# Patient Record
Sex: Female | Born: 1950 | Race: Black or African American | Hispanic: No | Marital: Single | Smoking: Current every day smoker
Health system: Southern US, Community
[De-identification: ages and names within clinical notes are randomized; demographics above are authoritative.]

## PROBLEM LIST (undated history)

## (undated) DIAGNOSIS — M8580 Other specified disorders of bone density and structure, unspecified site: Secondary | ICD-10-CM

## (undated) DIAGNOSIS — I10 Essential (primary) hypertension: Secondary | ICD-10-CM

## (undated) DIAGNOSIS — E785 Hyperlipidemia, unspecified: Secondary | ICD-10-CM

## (undated) HISTORY — DX: Other specified disorders of bone density and structure, unspecified site: M85.80

## (undated) HISTORY — DX: Essential (primary) hypertension: I10

## (undated) HISTORY — DX: Hyperlipidemia, unspecified: E78.5

---

## 2006-08-14 ENCOUNTER — Ambulatory Visit: Payer: Self-pay | Admitting: Nurse Practitioner

## 2006-08-25 ENCOUNTER — Ambulatory Visit (HOSPITAL_COMMUNITY): Admission: RE | Admit: 2006-08-25 | Discharge: 2006-08-25 | Payer: Self-pay | Admitting: Family Medicine

## 2006-08-25 ENCOUNTER — Ambulatory Visit (HOSPITAL_COMMUNITY): Admission: RE | Admit: 2006-08-25 | Discharge: 2006-08-25 | Payer: Self-pay | Admitting: Nurse Practitioner

## 2006-08-26 ENCOUNTER — Ambulatory Visit: Payer: Self-pay | Admitting: Nurse Practitioner

## 2006-08-26 ENCOUNTER — Encounter (INDEPENDENT_AMBULATORY_CARE_PROVIDER_SITE_OTHER): Payer: Self-pay | Admitting: Nurse Practitioner

## 2006-08-27 ENCOUNTER — Ambulatory Visit: Payer: Self-pay | Admitting: *Deleted

## 2006-10-19 ENCOUNTER — Ambulatory Visit: Payer: Self-pay | Admitting: Nurse Practitioner

## 2007-01-21 ENCOUNTER — Ambulatory Visit: Payer: Self-pay | Admitting: Internal Medicine

## 2007-03-24 ENCOUNTER — Encounter (INDEPENDENT_AMBULATORY_CARE_PROVIDER_SITE_OTHER): Payer: Self-pay | Admitting: *Deleted

## 2007-05-05 ENCOUNTER — Encounter (INDEPENDENT_AMBULATORY_CARE_PROVIDER_SITE_OTHER): Payer: Self-pay | Admitting: Nurse Practitioner

## 2007-05-05 ENCOUNTER — Ambulatory Visit: Payer: Self-pay | Admitting: Family Medicine

## 2007-05-05 LAB — CONVERTED CEMR LAB
ALT: 20 units/L (ref 0–35)
Albumin: 4.2 g/dL (ref 3.5–5.2)
Alkaline Phosphatase: 73 units/L (ref 39–117)
Indirect Bilirubin: 0.2 mg/dL (ref 0.0–0.9)
Total Bilirubin: 0.3 mg/dL (ref 0.3–1.2)
Total CHOL/HDL Ratio: 5.4
Total Protein: 7.1 g/dL (ref 6.0–8.3)

## 2007-06-09 ENCOUNTER — Encounter (INDEPENDENT_AMBULATORY_CARE_PROVIDER_SITE_OTHER): Payer: Self-pay | Admitting: Nurse Practitioner

## 2007-06-09 ENCOUNTER — Ambulatory Visit: Payer: Self-pay | Admitting: Internal Medicine

## 2007-06-09 LAB — CONVERTED CEMR LAB
Cholesterol: 183 mg/dL (ref 0–200)
Total CHOL/HDL Ratio: 4.2
VLDL: 50 mg/dL — ABNORMAL HIGH (ref 0–40)

## 2007-07-14 ENCOUNTER — Ambulatory Visit: Payer: Self-pay | Admitting: Family Medicine

## 2007-07-28 ENCOUNTER — Ambulatory Visit: Payer: Self-pay | Admitting: Internal Medicine

## 2007-09-09 ENCOUNTER — Ambulatory Visit: Payer: Self-pay | Admitting: Family Medicine

## 2007-09-22 ENCOUNTER — Ambulatory Visit: Payer: Self-pay | Admitting: Internal Medicine

## 2007-09-22 ENCOUNTER — Encounter (INDEPENDENT_AMBULATORY_CARE_PROVIDER_SITE_OTHER): Payer: Self-pay | Admitting: Nurse Practitioner

## 2007-09-22 LAB — CONVERTED CEMR LAB
Alkaline Phosphatase: 96 units/L (ref 39–117)
HDL: 34 mg/dL — ABNORMAL LOW (ref >39)
Indirect Bilirubin: 0.2 mg/dL (ref 0.0–0.9)
Total CHOL/HDL Ratio: 4.8
Total Protein: 7.8 g/dL (ref 6.0–8.3)
Triglycerides: 260 mg/dL — ABNORMAL HIGH (ref <150)

## 2007-12-23 ENCOUNTER — Ambulatory Visit: Payer: Self-pay | Admitting: Nurse Practitioner

## 2008-03-24 ENCOUNTER — Ambulatory Visit: Payer: Self-pay | Admitting: Internal Medicine

## 2008-06-13 ENCOUNTER — Ambulatory Visit: Payer: Self-pay | Admitting: Family Medicine

## 2008-06-13 ENCOUNTER — Encounter (INDEPENDENT_AMBULATORY_CARE_PROVIDER_SITE_OTHER): Payer: Self-pay | Admitting: Internal Medicine

## 2008-06-13 LAB — CONVERTED CEMR LAB: HDL: 39 mg/dL — ABNORMAL LOW (ref >39)

## 2008-09-11 ENCOUNTER — Ambulatory Visit: Payer: Self-pay | Admitting: Family Medicine

## 2008-09-12 ENCOUNTER — Ambulatory Visit (HOSPITAL_COMMUNITY): Admission: RE | Admit: 2008-09-12 | Discharge: 2008-09-12 | Payer: Self-pay | Admitting: Internal Medicine

## 2008-09-19 ENCOUNTER — Ambulatory Visit (HOSPITAL_COMMUNITY): Admission: RE | Admit: 2008-09-19 | Discharge: 2008-09-19 | Payer: Self-pay | Admitting: Internal Medicine

## 2008-09-28 ENCOUNTER — Encounter (INDEPENDENT_AMBULATORY_CARE_PROVIDER_SITE_OTHER): Payer: Self-pay | Admitting: *Deleted

## 2008-10-09 ENCOUNTER — Encounter (INDEPENDENT_AMBULATORY_CARE_PROVIDER_SITE_OTHER): Payer: Self-pay | Admitting: Internal Medicine

## 2008-10-09 ENCOUNTER — Ambulatory Visit: Payer: Self-pay | Admitting: Internal Medicine

## 2008-10-09 LAB — CONVERTED CEMR LAB
ALT: 17 units/L (ref 0–35)
AST: 19 units/L (ref 0–37)
Albumin: 4.8 g/dL (ref 3.5–5.2)
Alkaline Phosphatase: 105 units/L (ref 39–117)
Chloride: 105 meq/L (ref 96–112)
Cholesterol: 120 mg/dL (ref 0–200)
Creatinine, Ser: 1.24 mg/dL — ABNORMAL HIGH (ref 0.40–1.20)
Glucose, Bld: 90 mg/dL (ref 70–99)
LDL Cholesterol: 49 mg/dL (ref 0–99)
Potassium: 3.7 meq/L (ref 3.5–5.3)
Sodium: 142 meq/L (ref 135–145)
Total CHOL/HDL Ratio: 3.4

## 2008-12-27 ENCOUNTER — Ambulatory Visit: Payer: Self-pay | Admitting: Internal Medicine

## 2009-01-19 ENCOUNTER — Ambulatory Visit: Payer: Self-pay | Admitting: Internal Medicine

## 2009-01-25 ENCOUNTER — Ambulatory Visit: Payer: Self-pay | Admitting: Internal Medicine

## 2009-11-19 ENCOUNTER — Ambulatory Visit: Payer: Self-pay | Admitting: Internal Medicine

## 2009-11-19 LAB — CONVERTED CEMR LAB
ALT: 16 units/L (ref 0–35)
AST: 18 units/L (ref 0–37)
Alkaline Phosphatase: 100 units/L (ref 39–117)
Glucose, Bld: 100 mg/dL — ABNORMAL HIGH (ref 70–99)
Potassium: 4.4 meq/L (ref 3.5–5.3)
Sodium: 142 meq/L (ref 135–145)

## 2010-07-18 ENCOUNTER — Encounter (INDEPENDENT_AMBULATORY_CARE_PROVIDER_SITE_OTHER): Payer: Self-pay | Admitting: *Deleted

## 2010-07-18 LAB — CONVERTED CEMR LAB
ALT: 18 units/L (ref 0–35)
Calcium: 9.6 mg/dL (ref 8.4–10.5)
Chloride: 103 meq/L (ref 96–112)
Cholesterol: 258 mg/dL — ABNORMAL HIGH (ref 0–200)
Potassium: 4.4 meq/L (ref 3.5–5.3)
Sodium: 137 meq/L (ref 135–145)
Total Bilirubin: 0.3 mg/dL (ref 0.3–1.2)

## 2010-09-24 ENCOUNTER — Encounter (INDEPENDENT_AMBULATORY_CARE_PROVIDER_SITE_OTHER): Payer: Self-pay | Admitting: *Deleted

## 2010-09-24 LAB — CONVERTED CEMR LAB
Cholesterol: 150 mg/dL (ref 0–200)
LDL Cholesterol: 68 mg/dL (ref 0–99)
Total CHOL/HDL Ratio: 3.7
Triglycerides: 203 mg/dL — ABNORMAL HIGH (ref <150)

## 2013-03-02 ENCOUNTER — Encounter: Payer: Self-pay | Admitting: Family Medicine

## 2013-05-27 ENCOUNTER — Encounter: Payer: Self-pay | Admitting: Family Medicine

## 2013-05-27 ENCOUNTER — Ambulatory Visit (INDEPENDENT_AMBULATORY_CARE_PROVIDER_SITE_OTHER): Payer: BC Managed Care – PPO | Admitting: Family Medicine

## 2013-05-27 VITALS — BP 128/68 | HR 73 | Temp 98.4°F | Resp 16 | Ht 60.0 in | Wt 131.8 lb

## 2013-05-27 DIAGNOSIS — Z23 Encounter for immunization: Secondary | ICD-10-CM

## 2013-05-27 DIAGNOSIS — E785 Hyperlipidemia, unspecified: Secondary | ICD-10-CM

## 2013-05-27 DIAGNOSIS — Z79899 Other long term (current) drug therapy: Secondary | ICD-10-CM

## 2013-05-27 DIAGNOSIS — Z0000 Encounter for general adult medical examination without abnormal findings: Secondary | ICD-10-CM

## 2013-05-27 DIAGNOSIS — Z1211 Encounter for screening for malignant neoplasm of colon: Secondary | ICD-10-CM

## 2013-05-27 DIAGNOSIS — F17200 Nicotine dependence, unspecified, uncomplicated: Secondary | ICD-10-CM

## 2013-05-27 LAB — CBC WITH DIFFERENTIAL/PLATELET
Basophils Absolute: 0 10*3/uL (ref 0.0–0.1)
Hemoglobin: 11.5 g/dL — ABNORMAL LOW (ref 12.0–15.0)
Lymphocytes Relative: 49 % — ABNORMAL HIGH (ref 12–46)
Lymphs Abs: 4.9 10*3/uL — ABNORMAL HIGH (ref 0.7–4.0)
MCHC: 34.3 g/dL (ref 30.0–36.0)
MCV: 82.9 fL (ref 78.0–100.0)
Monocytes Relative: 7 % (ref 3–12)
Platelets: 294 10*3/uL (ref 150–400)
RDW: 15.4 % (ref 11.5–15.5)
WBC: 10 10*3/uL (ref 4.0–10.5)

## 2013-05-27 LAB — POCT URINALYSIS DIPSTICK
Bilirubin, UA: NEGATIVE
Blood, UA: NEGATIVE
Leukocytes, UA: NEGATIVE
Nitrite, UA: NEGATIVE

## 2013-05-27 LAB — COMPREHENSIVE METABOLIC PANEL
AST: 17 U/L (ref 0–37)
Albumin: 4.7 g/dL (ref 3.5–5.2)
Alkaline Phosphatase: 68 U/L (ref 39–117)
BUN: 12 mg/dL (ref 6–23)
Glucose, Bld: 114 mg/dL — ABNORMAL HIGH (ref 70–99)
Sodium: 137 mEq/L (ref 135–145)
Total Bilirubin: 0.3 mg/dL (ref 0.3–1.2)

## 2013-05-27 LAB — LDL CHOLESTEROL, DIRECT: Direct LDL: 213 mg/dL — ABNORMAL HIGH

## 2013-05-27 NOTE — Patient Instructions (Addendum)
Come back and see me in 2 months for your pap smear and tetanus shot.  Make sure that is a morning time appointment and do not have anything to eat that day other than black coffee (ok to do a little sugar substitute or a little powdered creamer.)  In ALL combined sources of ibuprofen (a.m. And p.m.) do not take more than 10 tabs a day and do not do this regularly - only when the pain is bad. Do NOT take ibuprofen every day - try not to take more than 3-4 days out of the week.  If you need something for sleep - do not take ibuprofen p.m. - take just plan diphenhydramine (generic benadryl which is the ingredient in ibuprofen p.m. That makes you sleepy.)   Also, if you need more pain control try substituting generic tylenol (acetaminophen) which also takes in a p.m. Formulations. Do NOT combine ibuprofen with any other over the counter medication other than tyelnol or acetaminophen - so no aleve, motrin, advil, goody's powders, bc powders, etc as these are all the same type of medication so you will accidentally overdose yourself which will harm your kidneys.  Keeping You Healthy  Get These Tests  Blood Pressure- Have your blood pressure checked by your healthcare provider at least once a year.  Normal blood pressure is 120/80.  Weight- Have your body mass index (BMI) calculated to screen for obesity.  BMI is a measure of body fat based on height and weight.  You can calculate your own BMI at https://www.west-esparza.com/  Cholesterol- Have your cholesterol checked every year.  Diabetes- Have your blood sugar checked every year if you have high blood pressure, high cholesterol, a family history of diabetes or if you are overweight.  Pap Smear- Have a pap smear every 1 to 3 years if you have been sexually active.  If you are older than 65 and recent pap smears have been normal you may not need additional pap smears.  In addition, if you have had a hysterectomy  For benign disease additional pap smears are  not necessary.  Mammogram-Yearly mammograms are essential for early detection of breast cancer  Screening for Colon Cancer- Colonoscopy starting at age 67. Screening may begin sooner depending on your family history and other health conditions.  Follow up colonoscopy as directed by your Gastroenterologist.  Screening for Osteoporosis- Screening begins at age 24 with bone density scanning, sooner if you are at higher risk for developing Osteoporosis.  Get these medicines  Calcium with Vitamin D- Your body requires 1200-1500 mg of Calcium a day and [phone] IU of Vitamin D a day.  You can only absorb 500 mg of Calcium at a time therefore Calcium must be taken in 2 or 3 separate doses throughout the day.  Hormones- Hormone therapy has been associated with increased risk for certain cancers and heart disease.  Talk to your healthcare provider about if you need relief from menopausal symptoms.  Aspirin- Ask your healthcare provider about taking Aspirin to prevent Heart Disease and Stroke.  Get these Immuniztions  Flu shot- Every fall  Pneumonia shot- Once after the age of 21; if you are younger ask your healthcare provider if you need a pneumonia shot.  Tetanus- Every ten years.  Zostavax- Once after the age of 51 to prevent shingles.  Take these steps  Don't smoke- Your healthcare provider can help you quit. For tips on how to quit, ask your healthcare provider or go to www.smokefree.gov or call  1-800 QUIT-NOW.  Be physically active- Exercise 5 days a week for a minimum of 30 minutes.  If you are not already physically active, start slow and gradually work up to 30 minutes of moderate physical activity.  Try walking, dancing, bike riding, swimming, etc.  Eat a healthy diet- Eat a variety of healthy foods such as fruits, vegetables, whole grains, low fat milk, low fat cheeses, yogurt, lean meats, chicken, fish, eggs, dried beans, tofu, etc.  For more information go to  www.thenutritionsource.org  Dental visit- Brush and floss teeth twice daily; visit your dentist twice a year.  Eye exam- Visit your Optometrist or Ophthalmologist yearly.  Drink alcohol in moderation- Limit alcohol intake to one drink or less a day.  Never drink and drive.  Depression- Your emotional health is as important as your physical health.  If you're feeling down or losing interest in things you normally enjoy, please talk to your healthcare provider.  Seat Belts- can save your life; always wear one  Smoke/Carbon Monoxide detectors- These detectors need to be installed on the appropriate level of your home.  Replace batteries at least once a year.  Violence- If anyone is threatening or hurting you, please tell your healthcare provider.  Living Will/ Health care power of attorney- Discuss with your healthcare provider and family. Smoking Hazards Smoking cigarettes is extremely bad for your health. Tobacco smoke has over 200 known poisons in it. There are over 60 chemicals in tobacco smoke that cause cancer. Some of the chemicals found in cigarette smoke include:   Cyanide.  Benzene.  Formaldehyde.  Methanol (wood alcohol).  Acetylene (fuel used in welding torches).  Ammonia. Cigarette smoke also contains the poisonous gases nitrogen oxide and carbon monoxide.  Cigarette smokers have an increased risk of many serious medical problems, including:  Lung cancer.  Lung disease (such as pneumonia, bronchitis, and emphysema).  Heart attack and chest pain due to the heart not getting enough oxygen (angina).  Heart disease and peripheral blood vessel disease.  Hypertension.  Stroke.  Oral cancer (cancer of the lip, mouth, or voice box).  Bladder cancer.  Pancreatic cancer.  Cervical cancer.  Pregnancy complications, including premature birth.  Low birthweight babies.  Early menopause.  Lower estrogen level for women.  Infertility.  Facial  wrinkles.  Blindness.  Increased risk of broken bones (fractures).  Senile dementia.  Stillbirths and smaller newborn babies, birth defects, and genetic damage to sperm.  Stomach ulcers and internal bleeding. Children of smokers have an increased risk of the following, because of secondhand smoke exposure:   Sudden infant death syndrome (SIDS).  Respiratory infections.  Lung cancer.  Heart disease.  Ear infections. Smoking causes approximately:  90% of all lung cancer deaths in men.  80% of all lung cancer deaths in women.  90% of deaths from chronic obstructive lung disease. Compared with nonsmokers, smoking increases the risk of:  Coronary heart disease by 2 to 4 times.  Stroke by 2 to 4 times.  Men developing lung cancer by 23 times.  Women developing lung cancer by 13 times.  Dying from chronic obstructive lung diseases by 12 times. Someone who smokes 2 packs a day loses about 8 years of his or her life. Even smoking lightly shortens your life expectancy by several years. You can greatly reduce the risk of medical problems for you and your family by stopping now. Smoking is the most preventable cause of death and disease in our society. Within days of quitting smoking,  your circulation returns to normal, you decrease the risk of having a heart attack, and your lung capacity improves. There may be some increased phlegm in the first few days after quitting, and it may take months for your lungs to clear up completely. Quitting for 10 years cuts your lung cancer risk to almost that of a nonsmoker. WHY IS SMOKING ADDICTIVE?  Nicotine is the chemical agent in tobacco that is capable of causing addiction or dependence.  When you smoke and inhale, nicotine is absorbed rapidly into the bloodstream through your lungs. Nicotine absorbed through the lungs is capable of creating a powerful addiction. Both inhaled and non-inhaled nicotine may be addictive.  Addiction studies of  cigarettes and spit tobacco show that addiction to nicotine occurs mainly during the teen years, when young people begin using tobacco products. WHAT ARE THE BENEFITS OF QUITTING?  There are many health benefits to quitting smoking.   Likelihood of developing cancer and heart disease decreases. Health improvements are seen almost immediately.  Blood pressure, pulse rate, and breathing patterns start returning to normal soon after quitting.  People who quit may see an improvement in their overall quality of life. Some people choose to quit all at once. Other options include nicotine replacement products, such as patches, gum, and nasal sprays. Do not use these products without first checking with your caregiver. QUITTING SMOKING It is not easy to quit smoking. Nicotine is addicting, and longtime habits are hard to change. To start, you can write down all your reasons for quitting, tell your family and friends you want to quit, and ask for their help. Throw your cigarettes away, chew gum or cinnamon sticks, keep your hands busy, and drink extra water or juice. Go for walks and practice deep breathing to relax. Think of all the money you are saving: around $1,000 a year, for the average pack-a-day smoker. Nicotine patches and gum have been shown to improve success at efforts to stop smoking. Zyban (bupropion) is an anti-depressant drug that can be prescribed to reduce nicotine withdrawal symptoms and to suppress the urge to smoke. Smoking is an addiction with both physical and psychological effects. Joining a stop-smoking support group can help you cope with the emotional issues. For more information and advice on programs to stop smoking, call your doctor, your local hospital, or these organizations:  American Lung Association - 1-800-LUNGUSA  American Cancer Society - 1-800-ACS-2345 Document Released: 07/31/2004 Document Revised: 09/15/2011 Document Reviewed: 12/13/2012 [facility] Patient Information  2014 [place], [place]. Smoking Cessation, Tips for Success YOU CAN QUIT SMOKING If you are ready to quit smoking, congratulations! You have chosen to help yourself be healthier. Cigarettes bring nicotine, tar, carbon monoxide, and other irritants into your body. Your lungs, heart, and blood vessels will be able to work better without these poisons. There are many different ways to quit smoking. Nicotine gum, nicotine patches, a nicotine inhaler, or nicotine nasal spray can help with physical craving. Hypnosis, support groups, and medicines help break the habit of smoking. Here are some tips to help you quit for good.  Throw away all cigarettes.  Clean and remove all ashtrays from your home, work, and car.  On a card, write down your reasons for quitting. Carry the card with you and read it when you get the urge to smoke.  Cleanse your body of nicotine. Drink enough water and fluids to keep your urine clear or pale yellow. Do this after quitting to flush the nicotine from your body.  Learn to predict your moods. Do not let a bad situation be your excuse to have a cigarette. Some situations in your life might tempt you into wanting a cigarette.  Never have "just one" cigarette. It leads to wanting another and another. Remind yourself of your decision to quit.  Change habits associated with smoking. If you smoked while driving or when feeling stressed, try other activities to replace smoking. Stand up when drinking your coffee. Brush your teeth after eating. Sit in a different chair when you read the paper. Avoid alcohol while trying to quit, and try to drink fewer caffeinated beverages. Alcohol and caffeine may urge you to smoke.  Avoid foods and drinks that can trigger a desire to smoke, such as sugary or spicy foods and alcohol.  Ask people who smoke not to smoke around you.  Have something planned to do right after eating or having a cup of coffee. Take a walk or exercise to perk you up. This  will help to keep you from overeating.  Try a relaxation exercise to calm you down and decrease your stress. Remember, you may be tense and nervous for the first 2 weeks after you quit, but this will pass.  Find new activities to keep your hands busy. Play with a pen, coin, or rubber band. Doodle or draw things on paper.  Brush your teeth right after eating. This will help cut down on the craving for the taste of tobacco after meals. You can try mouthwash, too.  Use oral substitutes, such as lemon drops, carrots, a cinnamon stick, or chewing gum, in place of cigarettes. Keep them handy so they are available when you have the urge to smoke.  When you have the urge to smoke, try deep breathing.  Designate your home as a nonsmoking area.  If you are a heavy smoker, ask your caregiver about a prescription for nicotine chewing gum. It can ease your withdrawal from nicotine.  Reward yourself. Set aside the cigarette money you save and buy yourself something nice.  Look for support from others. Join a support group or smoking cessation program. Ask someone at home or at work to help you with your plan to quit smoking.  Always ask yourself, "Do I need this cigarette or is this just a reflex?" Tell yourself, "Today, I choose not to smoke," or "I do not want to smoke." You are reminding yourself of your decision to quit, even if you do smoke a cigarette. HOW WILL I FEEL WHEN I QUIT SMOKING?  The benefits of not smoking start within days of quitting.  You may have symptoms of withdrawal because your body is used to nicotine (the addictive substance in cigarettes). You may crave cigarettes, be irritable, feel very hungry, cough often, get headaches, or have difficulty concentrating.  The withdrawal symptoms are only temporary. They are strongest when you first quit but will go away within 10 to 14 days.  When withdrawal symptoms occur, stay in control. Think about your reasons for quitting. Remind  yourself that these are signs that your body is healing and getting used to being without cigarettes.  Remember that withdrawal symptoms are easier to treat than the major diseases that smoking can cause.  Even after the withdrawal is over, expect periodic urges to smoke. However, these cravings are generally short-lived and will go away whether you smoke or not. Do not smoke!  If you relapse and smoke again, do not lose hope. Most smokers quit 3 times before  they are successful.  If you relapse, do not give up! Plan ahead and think about what you will do the next time you get the urge to smoke. LIFE AS A NONSMOKER: MAKE IT FOR A MONTH, MAKE IT FOR LIFE Day 1: Hang this page where you will see it every day. Day 2: Get rid of all ashtrays, matches, and lighters. Day 3: Drink water. Breathe deeply between sips. Day 4: Avoid places with smoke-filled air, such as bars, clubs, or the smoking section of restaurants. Day 5: Keep track of how much money you save by not smoking. Day 6: Avoid boredom. Keep a good book with you or go to the movies. Day 7: Reward yourself! One week without smoking! Day 8: Make a dental appointment to get your teeth cleaned. Day 9: Decide how you will turn down a cigarette before it is offered to you. Day 10: Review your reasons for quitting. Day 11: Distract yourself. Stay active to keep your mind off smoking and to relieve tension. Take a walk, exercise, read a book, do a crossword puzzle, or try a new hobby. Day 12: Exercise. Get off the bus before your stop or use stairs instead of escalators. Day 13: Call on friends for support and encouragement. Day 14: Reward yourself! Two weeks without smoking! Day 15: Practice deep breathing exercises. Day 16: Bet a friend that you can stay a nonsmoker. Day 17: Ask to sit in nonsmoking sections of restaurants. Day 18: Hang up "No Smoking" signs. Day 19: Think of yourself as a nonsmoker. Day 20: Each morning, tell yourself  you will not smoke. Day 21: Reward yourself! Three weeks without smoking! Day 22: Think of smoking in negative ways. Remember how it stains your teeth, gives you bad breath, and leaves you short of breath. Day 23: Eat a nutritious breakfast. Day 24:Do not relive your days as a smoker. Day 25: Hold a pencil in your hand when talking on the telephone. Day 26: Tell all your friends you do not smoke. Day 27: Think about how much better food tastes. Day 28: Remember, one cigarette is one too many. Day 29: Take up a hobby that will keep your hands busy. Day 30: Congratulations! One month without smoking! Give yourself a big reward. Your caregiver can direct you to community resources or hospitals for support, which may include:  Group support.  Education.  Hypnosis.  Subliminal therapy. Document Released: 03/21/2004 Document Revised: 09/15/2011 Document Reviewed: 12/09/2012 [facility] Patient Information 2014 [place], [place].

## 2013-05-27 NOTE — Progress Notes (Addendum)
Subjective:    Patient ID: Sharon Peterson, female    DOB: 11-Nov-1950, 63 y.o.   MRN: [id number] Chief Complaint  Patient presents with  . Annual Exam    no pap  . Medication Refill   HPI  Is not fasting today - ate about 3 hrs ago. Has not had any care since [facility] 1 1/2 yrs ago.  She does have her medical records.  Only prior med prob w/ [facility] - was on crestor 20 but has been out for > 1 yr.  Last pap smear was a long time ago but probably w/in the past 5 yrs - maybe w/in the past 3 yrs.  No h/o abnml pap smear. Will schedule a pap and a tdap - maybe next month but absolutely does not want to do today. No sexual activity in a long time - def >5 yrs.  No h/o colonscopy.   Smoking since she was 62 yo. - smoking almost 1 ppd - 17 cigs/d.  40 pack yr hx. Really no interest in cutting down - feels like she has already done well  Going to dentist, needs dentures so take ibuprofen 3-4 at night and ibuprofen 2-4 tabs sev times/day. No indigeston or heartburn or upset stomach.     History reviewed. No pertinent past medical history. History reviewed. No pertinent past surgical history. No current outpatient prescriptions on file prior to visit.   No current facility-administered medications on file prior to visit.   No Known Allergies History reviewed. No pertinent family history. History   Social History  . Marital Status: Single    Spouse Name: N/A    Number of Children: N/A  . Years of Education: N/A   Social History Main Topics  . Smoking status: Current Every Day Smoker  . Smokeless tobacco: None  . Alcohol Use: No  . Drug Use: No  . Sexual Activity: None   Other Topics Concern  . None   Social History Narrative   Significant other. Education: [employer].  retired, no exercise  Review of Systems  Constitutional: Negative.   HENT: Negative.   Eyes: Negative.   Respiratory: Negative.   Cardiovascular: Negative.   Gastrointestinal: Negative.   Endocrine:  Negative.   Genitourinary: Negative.   Musculoskeletal: Negative.   Skin: Negative.   Allergic/Immunologic: Negative.   Neurological: Negative.   Hematological: Negative.   Psychiatric/Behavioral: Negative.       BP 128/68  Pulse 73  Temp(Src) 98.4 F (36.9 C) (Oral)  Resp 16  Ht 5' (1.524 m)  Wt 131 lb 12.8 oz (59.784 kg)  BMI 25.74 kg/m2  SpO2 98% Objective:   Physical Exam  Constitutional: She is oriented to person, place, and time. She appears well-developed and well-nourished. No distress.  HENT:  Head: Normocephalic and atraumatic.  Right Ear: Tympanic membrane, external ear and ear canal normal.  Left Ear: Tympanic membrane, external ear and ear canal normal.  Nose: Nose normal. No mucosal edema or rhinorrhea.  Mouth/Throat: Uvula is midline, oropharynx is clear and moist and mucous membranes are normal. No posterior oropharyngeal erythema.  Eyes: Conjunctivae and EOM are normal. Pupils are equal, round, and reactive to light. Right eye exhibits no discharge. Left eye exhibits no discharge. No scleral icterus.  Neck: Normal range of motion. Neck supple. No thyromegaly present.  Cardiovascular: Normal rate, regular rhythm, normal heart sounds and intact distal pulses.   Pulmonary/Chest: Effort normal and breath sounds normal. No respiratory distress.  Abdominal: Soft. Bowel sounds  are normal. There is no tenderness.  Genitourinary: No breast swelling, tenderness, discharge or bleeding.  Musculoskeletal: She exhibits no edema.  Lymphadenopathy:    She has no cervical adenopathy.  Neurological: She is alert and oriented to person, place, and time.  Skin: Skin is warm and dry. She is not diaphoretic. No erythema.  Psychiatric: She has a normal mood and affect. Her behavior is normal.          Results for orders placed in visit on 05/27/13  POCT URINALYSIS DIPSTICK      Result Value Range   Color, UA yellow     Clarity, UA clear     Glucose, UA neg     Bilirubin,  UA neg     Ketones, UA neg     Spec Grav, UA <=1.005     Blood, UA neg     pH, UA 6.0     Protein, UA neg     Urobilinogen, UA 0.2     Nitrite, UA neg     Leukocytes, UA Negative     Assessment & Plan:   Routine general medical examination at a health care facility - Plan: MM Digital Screening, Hepatitis C antibody, Comprehensive metabolic panel, CBC with Differential, TSH, POCT urinalysis dipstick - will RTC for pap smear at next visit - sched for 2 mos, will do TDaP then as well.  Needs pneumovax but may want to wait till after 62 yo in April 2015.  Discuss zoster vac at f/u, discuss asa 81 at f/u, discuss ca/vit D supp at f/u. Will needs dexa referral after April 2015.  Tobacco use disorder- no interest in quitting further at this point as has already cut down some.  Need for prophylactic vaccination and inoculation against influenza - Plan: Flu Vaccine QUAD 36+ mos IM  Special screening for malignant neoplasms, colon - Plan: Ambulatory referral to Gastroenterology  Encounter for long-term (current) use of other medications  Other and unspecified hyperlipidemia - Plan: LDL Cholesterol, Direct, Comprehensive metabolic panel - not fasting to day so just check LDL to see if pt needs to restart statin immed. Check full fasting lipid panel at f/u.   Addendum: direct LDL 213 so have pt start atorvastatin 40 and recheck cmp and flp at f/u OV in 2 mos.  Meds ordered this encounter  Medications  . rosuvastatin (CRESTOR) 20 MG tablet    Sig: Take 20 mg by mouth daily.  Marland Kitchen OVER THE COUNTER MEDICATION    Sig: OTC fish Oil taking    Norberto Sorenson, MD MPH

## 2013-05-28 LAB — HEPATITIS C ANTIBODY: HCV Ab: NEGATIVE

## 2013-05-31 ENCOUNTER — Other Ambulatory Visit: Payer: Self-pay | Admitting: Family Medicine

## 2013-05-31 DIAGNOSIS — Z1231 Encounter for screening mammogram for malignant neoplasm of breast: Secondary | ICD-10-CM

## 2013-06-04 ENCOUNTER — Encounter: Payer: Self-pay | Admitting: Family Medicine

## 2013-06-04 DIAGNOSIS — F17200 Nicotine dependence, unspecified, uncomplicated: Secondary | ICD-10-CM | POA: Insufficient documentation

## 2013-06-04 DIAGNOSIS — E785 Hyperlipidemia, unspecified: Secondary | ICD-10-CM | POA: Insufficient documentation

## 2013-06-04 MED ORDER — ATORVASTATIN CALCIUM 40 MG PO TABS: 40.0000 mg | ORAL_TABLET | Freq: Every day | ORAL | Status: DC

## 2013-06-04 NOTE — Addendum Note (Signed)
Addended by: Norberto Sorenson on: 06/04/2013 01:57 AM   Modules accepted: Orders, Medications

## 2013-07-29 ENCOUNTER — Ambulatory Visit (INDEPENDENT_AMBULATORY_CARE_PROVIDER_SITE_OTHER): Payer: BC Managed Care – PPO | Admitting: Family Medicine

## 2013-07-29 VITALS — BP 128/78 | HR 79 | Temp 97.5°F | Resp 16 | Ht 60.0 in | Wt 130.0 lb

## 2013-07-29 DIAGNOSIS — F17200 Nicotine dependence, unspecified, uncomplicated: Secondary | ICD-10-CM

## 2013-07-29 DIAGNOSIS — N760 Acute vaginitis: Secondary | ICD-10-CM

## 2013-07-29 DIAGNOSIS — Z01419 Encounter for gynecological examination (general) (routine) without abnormal findings: Secondary | ICD-10-CM

## 2013-07-29 DIAGNOSIS — Z23 Encounter for immunization: Secondary | ICD-10-CM

## 2013-07-29 DIAGNOSIS — E785 Hyperlipidemia, unspecified: Secondary | ICD-10-CM

## 2013-07-29 DIAGNOSIS — Z79899 Other long term (current) drug therapy: Secondary | ICD-10-CM

## 2013-07-29 DIAGNOSIS — B9689 Other specified bacterial agents as the cause of diseases classified elsewhere: Secondary | ICD-10-CM

## 2013-07-29 LAB — COMPREHENSIVE METABOLIC PANEL
ALT: 12 U/L (ref 0–35)
AST: 21 U/L (ref 0–37)
Albumin: 4.8 g/dL (ref 3.5–5.2)
Alkaline Phosphatase: 65 U/L (ref 39–117)
BUN: 10 mg/dL (ref 6–23)
CHLORIDE: 105 meq/L (ref 96–112)
CO2: 23 mEq/L (ref 19–32)
Calcium: 10.3 mg/dL (ref 8.4–10.5)
Creat: 0.93 mg/dL (ref 0.50–1.10)
Glucose, Bld: 81 mg/dL (ref 70–99)
POTASSIUM: 4.8 meq/L (ref 3.5–5.3)
SODIUM: 137 meq/L (ref 135–145)
Total Bilirubin: 0.4 mg/dL (ref 0.3–1.2)
Total Protein: 7.7 g/dL (ref 6.0–8.3)

## 2013-07-29 LAB — LIPID PANEL
CHOL/HDL RATIO: 4.6 ratio
Cholesterol: 194 mg/dL (ref 0–200)
HDL: 42 mg/dL (ref >39)
LDL Cholesterol: 107 mg/dL — ABNORMAL HIGH (ref 0–99)
Triglycerides: 223 mg/dL — ABNORMAL HIGH (ref <150)
VLDL: 45 mg/dL — ABNORMAL HIGH (ref 0–40)

## 2013-07-29 NOTE — Progress Notes (Signed)
Subjective:    Patient ID: Sharon Peterson, female    DOB: [birth year], 63 y.o.   MRN: [id number]  Chief Complaint  Patient presents with  . Gynecologic Exam  . Follow-up    cholesterol check. not fasting, has had coffee w/cream & sugar  . Immunizations    TDAP   This chart was scribed for Sherren MochaEva N Tamula Morrical, MD by Dorothey Basemania Sutton, ED Scribe.   HPI Sharon Peterson is a 63 y.o. Female with a history of hyperlipidemia who presents to [facility] for a routine follow-up.  Patient is requesting a gynecological exam with pap smear, but does not have any associated vaginal complaints.  Has been many years since her last.  Patient is also following up for a cholesterol check. Patient states that she has not fasted this morning (had coffee with cream and sugar). At her last visit 2 months ago we did a direct LDL because she did not fast before that appointment. Her direct LDL was found to be 213 at that visit and patient was started on Lipitor.   Patient is a current, every day smoker w/ no intention of quitting.   Patient is also requesting a tetanus vaccination.  No past medical history on file. Current Outpatient Prescriptions on File Prior to Visit  Medication Sig Dispense Refill  . atorvastatin (LIPITOR) 40 MG tablet Take 1 tablet (40 mg total) by mouth daily.  90 tablet  0  . OVER THE COUNTER MEDICATION OTC fish Oil taking       No current facility-administered medications on file prior to visit.   No Known Allergies  Review of Systems  Constitutional: Negative for fever, chills, appetite change and unexpected weight change.  Respiratory: Negative for cough.   Gastrointestinal: Negative for vomiting, abdominal pain, diarrhea and constipation.  Genitourinary: Negative for vaginal bleeding, vaginal discharge, genital sores, vaginal pain and pelvic pain.  Skin: Negative for rash.  Neurological: Negative for dizziness and seizures.     Vitals: BP 128/78  Pulse 79   Temp(Src) 97.5 F (36.4 C)  Resp 16  Ht 5' (1.524 m)  Wt 130 lb (58.968 kg)  BMI 25.39 kg/m2  SpO2 99% Objective:   Physical Exam  Nursing note and vitals reviewed. Constitutional: She is oriented to person, place, and time. She appears well-developed and well-nourished. No distress.  HENT:  Head: Normocephalic and atraumatic.  Eyes: Conjunctivae are normal.  Neck: Normal range of motion. Neck supple.  Cardiovascular: Normal rate, regular rhythm and normal heart sounds.   Pulmonary/Chest: Effort normal and breath sounds normal. No respiratory distress.  Abdominal: She exhibits no distension.  Genitourinary: Vagina normal. Uterus is tender. Cervix exhibits discharge. Right adnexum displays no tenderness. Left adnexum displays no tenderness.  Cervix is highly vascular with diffuse erythematous pinpoint spotting. Small amount of white, creamy discharge from the cervix. Vagina otherwise normal. Uterus is tender to palpation, but without adnexal tenderness.   Musculoskeletal: Normal range of motion.  Neurological: She is alert and oriented to person, place, and time.  Skin: Skin is warm and dry.  Psychiatric: She has a normal mood and affect. Her behavior is normal.      Assessment & Plan:  9:47 AM- Need for Tdap vaccination - Plan: Tdap vaccine greater than or equal to 7yo IM  Other and unspecified hyperlipidemia - Plan: Lipid panel - advised to switch from whole milk to skim - at least 1%.  Recheck in 4 mos when FASTING.  Encounter for  long-term (current) use of other medications - Plan: Comprehensive metabolic panel  Tobacco use disorder - encouraged cessation - pt does not want to quit - has no intention of cutting down.  Encounter for routine gynecological examination - Plan: Pap IG, CT/NG NAA, and HPV (high risk)   Discussed treatment plan with patient at bedside and patient verbalized agreement.   I personally performed the services described in this documentation, which  was scribed in my presence. The recorded information has been reviewed and considered, and addended by me as needed.  Norberto Sorenson, MD MPH

## 2013-07-31 ENCOUNTER — Encounter: Payer: Self-pay | Admitting: Family Medicine

## 2013-08-01 LAB — PAP IG, CT-NG NAA, HPV HIGH-RISK
Chlamydia Probe Amp: NEGATIVE
GC Probe Amp: NEGATIVE
HPV DNA High Risk: NOT DETECTED

## 2013-08-02 ENCOUNTER — Encounter: Payer: Self-pay | Admitting: Family Medicine

## 2013-08-02 MED ORDER — METRONIDAZOLE 500 MG PO TABS: 500.0000 mg | ORAL_TABLET | Freq: Two times a day (BID) | ORAL | Status: DC

## 2013-08-02 NOTE — Addendum Note (Signed)
Addended by: Norberto SorensonSHAW, EVA on: 08/02/2013 02:46 PM   Modules accepted: Orders

## 2013-09-05 ENCOUNTER — Other Ambulatory Visit: Payer: Self-pay | Admitting: Family Medicine

## 2013-12-02 ENCOUNTER — Other Ambulatory Visit: Payer: Self-pay

## 2013-12-02 MED ORDER — ATORVASTATIN CALCIUM 40 MG PO TABS: ORAL_TABLET | ORAL | Status: DC

## 2013-12-09 ENCOUNTER — Other Ambulatory Visit: Payer: Self-pay | Admitting: Family Medicine

## 2013-12-09 ENCOUNTER — Ambulatory Visit (INDEPENDENT_AMBULATORY_CARE_PROVIDER_SITE_OTHER): Payer: Medicare HMO | Admitting: Family Medicine

## 2013-12-09 ENCOUNTER — Encounter: Payer: Self-pay | Admitting: Family Medicine

## 2013-12-09 VITALS — BP 124/71 | HR 72 | Temp 98.0°F | Resp 18 | Ht 60.0 in | Wt 126.4 lb

## 2013-12-09 DIAGNOSIS — E785 Hyperlipidemia, unspecified: Secondary | ICD-10-CM

## 2013-12-09 DIAGNOSIS — M8580 Other specified disorders of bone density and structure, unspecified site: Secondary | ICD-10-CM

## 2013-12-09 DIAGNOSIS — Z13820 Encounter for screening for osteoporosis: Secondary | ICD-10-CM

## 2013-12-09 DIAGNOSIS — Z79899 Other long term (current) drug therapy: Secondary | ICD-10-CM

## 2013-12-09 DIAGNOSIS — Z780 Asymptomatic menopausal state: Secondary | ICD-10-CM

## 2013-12-09 DIAGNOSIS — F17200 Nicotine dependence, unspecified, uncomplicated: Secondary | ICD-10-CM

## 2013-12-09 LAB — LIPID PANEL
CHOLESTEROL: 161 mg/dL (ref 0–200)
HDL: 33 mg/dL — AB (ref >39)
LDL CALC: 92 mg/dL (ref 0–99)
TRIGLYCERIDES: 178 mg/dL — AB (ref <150)
Total CHOL/HDL Ratio: 4.9 Ratio
VLDL: 36 mg/dL (ref 0–40)

## 2013-12-09 LAB — COMPREHENSIVE METABOLIC PANEL
ALBUMIN: 4.6 g/dL (ref 3.5–5.2)
ALK PHOS: 66 U/L (ref 39–117)
ALT: 12 U/L (ref 0–35)
AST: 18 U/L (ref 0–37)
BILIRUBIN TOTAL: 0.3 mg/dL (ref 0.2–1.2)
BUN: 8 mg/dL (ref 6–23)
CALCIUM: 9.8 mg/dL (ref 8.4–10.5)
CO2: 19 meq/L (ref 19–32)
CREATININE: 1.01 mg/dL (ref 0.50–1.10)
Chloride: 106 mEq/L (ref 96–112)
GLUCOSE: 116 mg/dL — AB (ref 70–99)
POTASSIUM: 4.3 meq/L (ref 3.5–5.3)
Sodium: 136 mEq/L (ref 135–145)
TOTAL PROTEIN: 7.3 g/dL (ref 6.0–8.3)

## 2013-12-09 MED ORDER — ATORVASTATIN CALCIUM 40 MG PO TABS: ORAL_TABLET | ORAL | Status: DC

## 2013-12-09 NOTE — Patient Instructions (Addendum)
Call the breast center to schedule your mammogram and your DEXA bone scan at your convenience [address], [place], [place] [address]  [phone]  Come back in see me in late January/early February for your physical.  Fat and Cholesterol Control Diet Fat and cholesterol levels in your blood and organs are influenced by your diet. High levels of fat and cholesterol may lead to diseases of the heart, small and large blood vessels, gallbladder, liver, and pancreas. CONTROLLING FAT AND CHOLESTEROL WITH DIET Although exercise and lifestyle factors are important, your diet is key. That is because certain foods are known to raise cholesterol and others to lower it. The goal is to balance foods for their effect on cholesterol and more importantly, to replace saturated and trans fat with other types of fat, such as monounsaturated fat, polyunsaturated fat, and omega-3 fatty acids. On average, a person should consume no more than 15 to 17 g of saturated fat daily. Saturated and trans fats are considered "bad" fats, and they will raise LDL cholesterol. Saturated fats are primarily found in animal products such as meats, butter, and cream. However, that does not mean you need to give up all your favorite foods. Today, there are good tasting, low-fat, low-cholesterol substitutes for most of the things you like to eat. Choose low-fat or nonfat alternatives. Choose round or loin cuts of red meat. These types of cuts are lowest in fat and cholesterol. Chicken (without the skin), fish, veal, and ground [place] breast are great choices. Eliminate fatty meats, such as hot dogs and salami. Even shellfish have little or no saturated fat. Have a 3 oz (85 g) portion when you eat lean meat, poultry, or fish. Trans fats are also called "partially hydrogenated oils." They are oils that have been scientifically manipulated so that they are solid at room temperature resulting in a longer shelf life and improved taste  and texture of foods in which they are added. Trans fats are found in stick margarine, some tub margarines, cookies, crackers, and baked goods.  When baking and cooking, oils are a great substitute for butter. The monounsaturated oils are especially beneficial since it is believed they lower LDL and raise HDL. The oils you should avoid entirely are saturated tropical oils, such as coconut and palm.  Remember to eat a lot from food groups that are naturally free of saturated and trans fat, including fish, fruit, vegetables, beans, grains (barley, rice, couscous, bulgur wheat), and pasta (without cream sauces).  IDENTIFYING FOODS THAT LOWER FAT AND CHOLESTEROL  Soluble fiber may lower your cholesterol. This type of fiber is found in fruits such as apples, vegetables such as broccoli, potatoes, and carrots, legumes such as beans, peas, and lentils, and grains such as barley. Foods fortified with plant sterols (phytosterol) may also lower cholesterol. You should eat at least 2 g per day of these foods for a cholesterol lowering effect.  Read package labels to identify low-saturated fats, trans fat free, and low-fat foods at the supermarket. Select cheeses that have only 2 to 3 g saturated fat per ounce. Use a heart-healthy tub margarine that is free of trans fats or partially hydrogenated oil. When buying baked goods (cookies, crackers), avoid partially hydrogenated oils. Breads and muffins should be made from whole grains (whole-wheat or whole oat flour, instead of "flour" or "enriched flour"). Buy non-creamy canned soups with reduced salt and no added fats.  FOOD PREPARATION TECHNIQUES  Never deep-fry. If you must fry, either stir-fry,  which uses very little fat, or use non-stick cooking sprays. When possible, broil, bake, or roast meats, and steam vegetables. Instead of putting butter or margarine on vegetables, use lemon and herbs, applesauce, and cinnamon (for squash and sweet potatoes). Use nonfat yogurt,  salsa, and low-fat dressings for salads.  LOW-SATURATED FAT / LOW-FAT FOOD SUBSTITUTES Meats / Saturated Fat (g)  Avoid: Steak, marbled (3 oz/85 g) / 11 g  Choose: Steak, lean (3 oz/85 g) / 4 g  Avoid: Hamburger (3 oz/85 g) / 7 g  Choose: Hamburger, lean (3 oz/85 g) / 5 g  Avoid: Ham (3 oz/85 g) / 6 g  Choose: Ham, lean cut (3 oz/85 g) / 2.4 g  Avoid: Chicken, with skin, dark meat (3 oz/85 g) / 4 g  Choose: Chicken, skin removed, dark meat (3 oz/85 g) / 2 g  Avoid: Chicken, with skin, light meat (3 oz/85 g) / 2.5 g  Choose: Chicken, skin removed, light meat (3 oz/85 g) / 1 g Dairy / Saturated Fat (g)  Avoid: Whole milk (1 cup) / 5 g  Choose: Low-fat milk, 2% (1 cup) / 3 g  Choose: Low-fat milk, 1% (1 cup) / 1.5 g  Choose: Skim milk (1 cup) / 0.3 g  Avoid: Hard cheese (1 oz/28 g) / 6 g  Choose: Skim milk cheese (1 oz/28 g) / 2 to 3 g  Avoid: Cottage cheese, 4% fat (1 cup) / 6.5 g  Choose: Low-fat cottage cheese, 1% fat (1 cup) / 1.5 g  Avoid: Ice cream (1 cup) / 9 g  Choose: Sherbet (1 cup) / 2.5 g  Choose: Nonfat frozen yogurt (1 cup) / 0.3 g  Choose: Frozen fruit bar / trace  Avoid: Whipped cream (1 tbs) / 3.5 g  Choose: Nondairy whipped topping (1 tbs) / 1 g Condiments / Saturated Fat (g)  Avoid: Mayonnaise (1 tbs) / 2 g  Choose: Low-fat mayonnaise (1 tbs) / 1 g  Avoid: Butter (1 tbs) / 7 g  Choose: Extra light margarine (1 tbs) / 1 g  Avoid: Coconut oil (1 tbs) / 11.8 g  Choose: Olive oil (1 tbs) / 1.8 g  Choose: Corn oil (1 tbs) / 1.7 g  Choose: Safflower oil (1 tbs) / 1.2 g  Choose: Sunflower oil (1 tbs) / 1.4 g  Choose: Soybean oil (1 tbs) / 2.4 g  Choose: Canola oil (1 tbs) / 1 g Document Released: 06/23/2005 Document Revised: 10/18/2012 Document Reviewed: 12/12/2010 ExitCare Patient Information 2014 [place], [place]. Smoking Cessation Quitting smoking is important to your health and has many advantages. However, it is not always  easy to quit since nicotine is a very addictive drug. Often times, people try 3 times or more before being able to quit. This document explains the best ways for you to prepare to quit smoking. Quitting takes hard work and a lot of effort, but you can do it. ADVANTAGES OF QUITTING SMOKING  You will live longer, feel better, and live better.  Your body will feel the impact of quitting smoking almost immediately.  Within 20 minutes, blood pressure decreases. Your pulse returns to its normal level.  After 8 hours, carbon monoxide levels in the blood return to normal. Your oxygen level increases.  After 24 hours, the chance of having a heart attack starts to decrease. Your breath, hair, and body stop smelling like smoke.  After 48 hours, damaged nerve endings begin to recover. Your sense of taste and smell improve.  After 72 hours, the body is virtually free of nicotine. Your bronchial tubes relax and breathing becomes easier.  After 2 to 12 weeks, lungs can hold more air. Exercise becomes easier and circulation improves.  The risk of having a heart attack, stroke, cancer, or lung disease is greatly reduced.  After 1 year, the risk of coronary heart disease is cut in half.  After 5 years, the risk of stroke falls to the same as a nonsmoker.  After 10 years, the risk of lung cancer is cut in half and the risk of other cancers decreases significantly.  After 15 years, the risk of coronary heart disease drops, usually to the level of a nonsmoker.  If you are pregnant, quitting smoking will improve your chances of having a healthy baby.  The people you live with, especially any children, will be healthier.  You will have extra money to spend on things other than cigarettes. QUESTIONS TO THINK ABOUT BEFORE ATTEMPTING TO QUIT You may want to talk about your answers with your caregiver.  Why do you want to quit?  If you tried to quit in the past, what helped and what did not?  What will  be the most difficult situations for you after you quit? How will you plan to handle them?  Who can help you through the tough times? Your family? Friends? A caregiver?  What pleasures do you get from smoking? What ways can you still get pleasure if you quit? Here are some questions to ask your caregiver:  How can you help me to be successful at quitting?  What medicine do you think would be best for me and how should I take it?  What should I do if I need more help?  What is smoking withdrawal like? How can I get information on withdrawal? GET READY  Set a quit date.  Change your environment by getting rid of all cigarettes, ashtrays, matches, and lighters in your home, car, or work. Do not let people smoke in your home.  Review your past attempts to quit. Think about what worked and what did not. GET SUPPORT AND ENCOURAGEMENT You have a better chance of being successful if you have help. You can get support in many ways.  Tell your family, friends, and co-workers that you are going to quit and need their support. Ask them not to smoke around you.  Get individual, group, or telephone counseling and support. Programs are available at [employer] and health centers. Call your local health department for information about programs in your area.  Spiritual beliefs and practices may help some smokers quit.  Download a "quit meter" on your computer to keep track of quit statistics, such as how long you have gone without smoking, cigarettes not smoked, and money saved.  Get a self-help book about quitting smoking and staying off of tobacco. LEARN NEW SKILLS AND BEHAVIORS  Distract yourself from urges to smoke. Talk to someone, go for a walk, or occupy your time with a task.  Change your normal routine. Take a different route to work. Drink tea instead of coffee. Eat breakfast in a different place.  Reduce your stress. Take a hot bath, exercise, or read a book.  Plan something  enjoyable to do every day. Reward yourself for not smoking.  Explore interactive web-based programs that specialize in helping you quit. GET MEDICINE AND USE IT CORRECTLY Medicines can help you stop smoking and decrease the urge to smoke. Combining medicine with the  above behavioral methods and support can greatly increase your chances of successfully quitting smoking.  Nicotine replacement therapy helps deliver nicotine to your body without the negative effects and risks of smoking. Nicotine replacement therapy includes nicotine gum, lozenges, inhalers, nasal sprays, and skin patches. Some may be available over-the-counter and others require a prescription.  Antidepressant medicine helps people abstain from smoking, but how this works is unknown. This medicine is available by prescription.  Nicotinic receptor partial agonist medicine simulates the effect of nicotine in your brain. This medicine is available by prescription. Ask your caregiver for advice about which medicines to use and how to use them based on your health history. Your caregiver will tell you what side effects to look out for if you choose to be on a medicine or therapy. Carefully read the information on the package. Do not use any other product containing nicotine while using a nicotine replacement product.  RELAPSE OR DIFFICULT SITUATIONS Most relapses occur within the first 3 months after quitting. Do not be discouraged if you start smoking again. Remember, most people try several times before finally quitting. You may have symptoms of withdrawal because your body is used to nicotine. You may crave cigarettes, be irritable, feel very hungry, cough often, get headaches, or have difficulty concentrating. The withdrawal symptoms are only temporary. They are strongest when you first quit, but they will go away within 10 14 days. To reduce the chances of relapse, try to:  Avoid drinking alcohol. Drinking lowers your chances of  successfully quitting.  Reduce the amount of caffeine you consume. Once you quit smoking, the amount of caffeine in your body increases and can give you symptoms, such as a rapid heartbeat, sweating, and anxiety.  Avoid smokers because they can make you want to smoke.  Do not let weight gain distract you. Many smokers will gain weight when they quit, usually less than 10 pounds. Eat a healthy diet and stay active. You can always lose the weight gained after you quit.  Find ways to improve your mood other than smoking. FOR MORE INFORMATION  www.smokefree.gov  Document Released: 06/17/2001 Document Revised: 12/23/2011 Document Reviewed: 10/02/2011 [facility] Patient Information 2014 [place], [place].

## 2013-12-09 NOTE — Progress Notes (Signed)
Subjective:    Patient ID: Sharon Peterson, female    DOB: [birth year], 63 y.o.   MRN: [id number] Chief Complaint  Patient presents with  . Follow-up    HPI  Did have cream and sugar in her coffee this morning. Smoking 1 ppd of cigaretes (but states it is 1/2 ppd but she opens a new pack every day - just smokes some of the old pack the next morning before opening the new pack). Feeling great. No prob with cholesterol medication She has been baking her chicken, cut down bacon till once/mo.  Still drinking whole milk but less.   Taking fish oil one pill once a day.  No other supplements. Takes ibuprofen at night - helps put her to sleep   No past medical history on file. Current Outpatient Prescriptions on File Prior to Visit  Medication Sig Dispense Refill  . atorvastatin (LIPITOR) 40 MG tablet TAKE 1 TABLET BY MOUTH DAILY  90 tablet  0  . metroNIDAZOLE (FLAGYL) 500 MG tablet Take 1 tablet (500 mg total) by mouth 2 (two) times daily.  14 tablet  0  . OVER THE COUNTER MEDICATION OTC fish Oil taking       No current facility-administered medications on file prior to visit.   No Known Allergies ' Review of Systems  Constitutional: Negative for fever, chills, diaphoresis, appetite change and fatigue.  Eyes: Negative for visual disturbance.  Respiratory: Negative for cough and shortness of breath.   Cardiovascular: Negative for chest pain, palpitations and leg swelling.  Genitourinary: Negative for decreased urine volume.  Musculoskeletal: Negative for myalgias.  Neurological: Negative for syncope and headaches.  Hematological: Does not bruise/bleed easily.  Psychiatric/Behavioral: Negative for sleep disturbance and dysphoric mood. The patient is not nervous/anxious.       BP 124/71  Pulse 72  Temp(Src) 98 F (36.7 C) (Oral)  Resp 18  Ht 5' (1.524 m)  Wt 126 lb 6.4 oz (57.335 kg)  BMI 24.69 kg/m2  SpO2 100% Objective:   Physical Exam  Constitutional: She is oriented to  person, place, and time. She appears well-developed and well-nourished. No distress.  HENT:  Head: Normocephalic and atraumatic.  Right Ear: External ear normal.  Left Ear: External ear normal.  Eyes: Conjunctivae are normal. No scleral icterus.  Neck: Normal range of motion. Neck supple. No thyromegaly present.  Cardiovascular: Normal rate, regular rhythm, normal heart sounds and intact distal pulses.   Pulmonary/Chest: Effort normal and breath sounds normal. No respiratory distress.  Musculoskeletal: She exhibits no edema.  Lymphadenopathy:    She has no cervical adenopathy.  Neurological: She is alert and oriented to person, place, and time.  Skin: Skin is warm and dry. She is not diaphoretic. No erythema.  Psychiatric: She has a normal mood and affect. Her behavior is normal.      Assessment & Plan:   Other and unspecified hyperlipidemia - Plan: Comprehensive metabolic panel, Lipid panel  Tobacco use disorder - Plan: Comprehensive metabolic panel, Lipid panel  Screening for osteoporosis - Plan: DG Bone Density, Comprehensive metabolic panel, Lipid panel  Postmenopausal state - Plan: DG Bone Density, Comprehensive metabolic panel, Lipid panel  Encounter for long-term (current) use of other medications - Plan: Comprehensive metabolic panel, Lipid panel  Osteopenia - newly found on dexa scan - needs frax score and vit D level at next OV  Meds ordered this encounter  Medications  . atorvastatin (LIPITOR) 40 MG tablet    Sig: TAKE 1 TABLET BY MOUTH  DAILY    Dispense:  90 tablet    Refill:  3     Norberto Sorenson, MD MPH

## 2013-12-13 LAB — HEMOGLOBIN A1C
Hgb A1c MFr Bld: 6.6 % — ABNORMAL HIGH (ref <5.7)
MEAN PLASMA GLUCOSE: 143 mg/dL — AB (ref <117)

## 2013-12-15 ENCOUNTER — Encounter: Payer: Self-pay | Admitting: Family Medicine

## 2013-12-16 ENCOUNTER — Ambulatory Visit (HOSPITAL_COMMUNITY)
Admission: RE | Admit: 2013-12-16 | Discharge: 2013-12-16 | Disposition: A | Discharge status: Home / Self Care | Payer: Medicare HMO | Source: Ambulatory Visit | Attending: Family Medicine | Admitting: Family Medicine

## 2013-12-16 DIAGNOSIS — M8580 Other specified disorders of bone density and structure, unspecified site: Secondary | ICD-10-CM

## 2013-12-16 DIAGNOSIS — Z1231 Encounter for screening mammogram for malignant neoplasm of breast: Secondary | ICD-10-CM | POA: Insufficient documentation

## 2013-12-16 DIAGNOSIS — Z13820 Encounter for screening for osteoporosis: Secondary | ICD-10-CM | POA: Insufficient documentation

## 2013-12-16 DIAGNOSIS — Z780 Asymptomatic menopausal state: Secondary | ICD-10-CM | POA: Insufficient documentation

## 2013-12-16 HISTORY — DX: Other specified disorders of bone density and structure, unspecified site: M85.80

## 2013-12-19 ENCOUNTER — Encounter: Payer: Self-pay | Admitting: Family Medicine

## 2014-01-05 ENCOUNTER — Encounter: Payer: Self-pay | Admitting: Family Medicine

## 2014-03-01 ENCOUNTER — Other Ambulatory Visit: Payer: Self-pay | Admitting: Family Medicine

## 2014-08-10 DIAGNOSIS — H5203 Hypermetropia, bilateral: Secondary | ICD-10-CM | POA: Diagnosis not present

## 2014-08-10 DIAGNOSIS — H5210 Myopia, unspecified eye: Secondary | ICD-10-CM | POA: Diagnosis not present

## 2014-12-15 ENCOUNTER — Other Ambulatory Visit: Payer: Self-pay | Admitting: Family Medicine

## 2015-02-08 ENCOUNTER — Other Ambulatory Visit: Payer: Self-pay | Admitting: Family Medicine

## 2015-02-08 DIAGNOSIS — Z1231 Encounter for screening mammogram for malignant neoplasm of breast: Secondary | ICD-10-CM

## 2015-02-14 ENCOUNTER — Ambulatory Visit (HOSPITAL_COMMUNITY)
Admission: RE | Admit: 2015-02-14 | Discharge: 2015-02-14 | Disposition: A | Discharge status: Home / Self Care | Payer: Commercial Managed Care - HMO | Source: Ambulatory Visit | Attending: Family Medicine | Admitting: Family Medicine

## 2015-02-14 DIAGNOSIS — Z1231 Encounter for screening mammogram for malignant neoplasm of breast: Secondary | ICD-10-CM | POA: Diagnosis not present

## 2015-05-10 ENCOUNTER — Ambulatory Visit: Payer: Medicare HMO | Admitting: Family Medicine

## 2015-06-01 ENCOUNTER — Ambulatory Visit: Payer: Medicare HMO | Admitting: Family Medicine

## 2015-06-01 ENCOUNTER — Ambulatory Visit (INDEPENDENT_AMBULATORY_CARE_PROVIDER_SITE_OTHER): Payer: Commercial Managed Care - HMO | Admitting: Family Medicine

## 2015-06-01 ENCOUNTER — Encounter: Payer: Self-pay | Admitting: Family Medicine

## 2015-06-01 VITALS — BP 110/62 | HR 85 | Temp 97.7°F | Resp 16 | Ht 60.5 in | Wt 117.2 lb

## 2015-06-01 DIAGNOSIS — Z5181 Encounter for therapeutic drug level monitoring: Secondary | ICD-10-CM | POA: Diagnosis not present

## 2015-06-01 DIAGNOSIS — Z23 Encounter for immunization: Secondary | ICD-10-CM | POA: Diagnosis not present

## 2015-06-01 DIAGNOSIS — E119 Type 2 diabetes mellitus without complications: Secondary | ICD-10-CM | POA: Diagnosis not present

## 2015-06-01 DIAGNOSIS — E559 Vitamin D deficiency, unspecified: Secondary | ICD-10-CM

## 2015-06-01 DIAGNOSIS — E785 Hyperlipidemia, unspecified: Secondary | ICD-10-CM

## 2015-06-01 DIAGNOSIS — F17200 Nicotine dependence, unspecified, uncomplicated: Secondary | ICD-10-CM

## 2015-06-01 DIAGNOSIS — M8580 Other specified disorders of bone density and structure, unspecified site: Secondary | ICD-10-CM

## 2015-06-01 LAB — COMPREHENSIVE METABOLIC PANEL
ALBUMIN: 4.4 g/dL (ref 3.6–5.1)
ALT: 12 U/L (ref 6–29)
AST: 17 U/L (ref 10–35)
Alkaline Phosphatase: 69 U/L (ref 33–130)
BUN: 12 mg/dL (ref 7–25)
CALCIUM: 9.5 mg/dL (ref 8.6–10.4)
CHLORIDE: 106 mmol/L (ref 98–110)
CO2: 21 mmol/L (ref 20–31)
Creat: 1.01 mg/dL — ABNORMAL HIGH (ref 0.50–0.99)
Glucose, Bld: 113 mg/dL — ABNORMAL HIGH (ref 65–99)
POTASSIUM: 4.3 mmol/L (ref 3.5–5.3)
Sodium: 138 mmol/L (ref 135–146)
TOTAL PROTEIN: 7.4 g/dL (ref 6.1–8.1)
Total Bilirubin: 0.3 mg/dL (ref 0.2–1.2)

## 2015-06-01 LAB — HEMOGLOBIN A1C
HEMOGLOBIN A1C: 6.3 % — AB (ref <5.7)
MEAN PLASMA GLUCOSE: 134 mg/dL — AB (ref <117)

## 2015-06-01 NOTE — Progress Notes (Signed)
Subjective:    Patient ID: Sharon Peterson, female    DOB: [birth year], 64 y.o.   MRN: [id number]  Chief Complaint  Patient presents with  . Medication Refill    HPI  Smoking 1 ppd of cigaretes (but states it is 1/2 ppd but she opens a new pack every day - just smokes some of the old pack the next morning before opening the new pack). Has cut down to not opening a new pack every other day now rather than every day (so smoking 2/3 ppd maybe) Hanging out with great grandson - going to be 6 mos and can't smoke around him. Feeling great. No prob with cholesterol medication She has been baking her chicken, cut down bacon till once/mo.  Still drinking whole milk but less - cannot drink 2% - it is completely unpalatable to her - she triedc to gradually wean - puts cream into 2%. Taking fish oil one pill once a day.  No other supplements inc calcium and supp. Stopped taking regular ibuprofen.  Does not have a sweet tooth but has been trying to decrease carbs a diet in little but challenging for her.     [facility] 06/01/2015 12/09/2013  Decreased Interest 0 0  Down, Depressed, Hopeless 0 0  PHQ - 2 Score 0 0     Past Medical History  Diagnosis Date  . Osteopenia of the elderly 12/16/2013    T score -1.3 at left femur   Current Outpatient Prescriptions on File Prior to Visit  Medication Sig Dispense Refill  . atorvastatin (LIPITOR) 40 MG tablet TAKE 1 TABLET EVERY DAY.  "OV NEEDED FOR ADDITIONAL REFILLS" 30 tablet 0  . OVER THE COUNTER MEDICATION OTC fish Oil taking     No current facility-administered medications on file prior to visit.   No Known Allergies ' Review of Systems  Constitutional: Negative for fever, chills, diaphoresis, activity change, appetite change, fatigue and unexpected weight change.  Respiratory: Negative for cough and wheezing.   Cardiovascular: Negative for chest pain, palpitations and leg swelling.  Endocrine: Negative for polydipsia,  polyphagia and polyuria.  Genitourinary: Negative for dysuria, urgency and frequency.  Psychiatric/Behavioral: Negative for dysphoric mood. The patient is not nervous/anxious.        Objective:   Physical Exam  Constitutional: She is oriented to person, place, and time. She appears well-developed and well-nourished. No distress.  HENT:  Head: Normocephalic and atraumatic.  Right Ear: External ear normal.  Left Ear: External ear normal.  Eyes: Conjunctivae are normal. No scleral icterus.  Neck: Normal range of motion. Neck supple. No thyromegaly present.  Cardiovascular: Normal rate, regular rhythm, normal heart sounds and intact distal pulses.   Pulmonary/Chest: Effort normal and breath sounds normal. No respiratory distress.  Musculoskeletal: She exhibits no edema.  Lymphadenopathy:    She has no cervical adenopathy.  Neurological: She is alert and oriented to person, place, and time.  Skin: Skin is warm and dry. She is not diaphoretic. No erythema.  Psychiatric: She has a normal mood and affect. Her behavior is normal.   BP 110/62 mmHg  Pulse 85  Temp(Src) 97.7 F (36.5 C) (Oral)  Resp 16  Ht 5' 0.5" (1.537 m)  Wt 117 lb 3.2 oz (53.162 kg)  BMI 22.50 kg/m2  SpO2 97%     Assessment & Plan:   1. Osteopenia - initial diagnosis made on first dexa 6 mos prior left femur T score -1.3 - due for repeat 06/17.  start ca/vit D supp, weight bearing exercise. check vit D - low at 10 so start 6 mos of high dose replacement followed by otc supp when course is complete  2. Medication monitoring encounter   3. Hyperlipidemia - not fasting today, doing well on lipitor 40.  4. Tobacco use disorder - trying to cut down - down to 2/3 ppd from 1 ppd. Pt candidate for lung ca screening CT - discuss again at f/u. Pt will only consent to 1 immunizaiton today so flu shot and needs pna at next OV.  5. Type 2 diabetes mellitus without complication, without long-term current use of insulin (HCC) - new  diagnosis with a1c 6.6 sev mos ago - through modifying diet/exercise down to 6.3 today.  Lost 9 lbs in past 5 mos and 13 lbs past yr with diet   6. Need for prophylactic vaccination and inoculation against influenza    Orders Placed This Encounter  Procedures  . Flu Vaccine QUAD 36+ mos IM  . VITAMIN D 25 Hydroxy (Vit-D Deficiency, Fractures)  . Comprehensive metabolic panel    Order Specific Question:  Has the patient fasted?    Answer:  Yes  . Hemoglobin A1c     Norberto Sorenson, MD MPH  Results for orders placed or performed in visit on 06/01/15  VITAMIN D 25 Hydroxy (Vit-D Deficiency, Fractures)  Result Value Ref Range   Vit D, 25-Hydroxy 10 (L) 30 - 100 ng/mL  Comprehensive metabolic panel  Result Value Ref Range   Sodium 138 135 - 146 mmol/L   Potassium 4.3 3.5 - 5.3 mmol/L   Chloride 106 98 - 110 mmol/L   CO2 21 20 - 31 mmol/L   Glucose, Bld 113 (H) 65 - 99 mg/dL   BUN 12 7 - 25 mg/dL   Creat 1.61 (H) 0.96 - 0.99 mg/dL   Total Bilirubin 0.3 0.2 - 1.2 mg/dL   Alkaline Phosphatase 69 33 - 130 U/L   AST 17 10 - 35 U/L   ALT 12 6 - 29 U/L   Total Protein 7.4 6.1 - 8.1 g/dL   Albumin 4.4 3.6 - 5.1 g/dL   Calcium 9.5 8.6 - 04.5 mg/dL  Hemoglobin W0J  Result Value Ref Range   Hgb A1c MFr Bld 6.3 (H) <5.7 %   Mean Plasma Glucose 134 (H) <117 mg/dL

## 2015-06-01 NOTE — Patient Instructions (Signed)
Fat and Cholesterol Restricted Diet High levels of fat and cholesterol in your blood may lead to various health problems, such as diseases of the heart, blood vessels, gallbladder, liver, and pancreas. Fats are concentrated sources of energy that come in various forms. Certain types of fat, including saturated fat, may be harmful in excess. Cholesterol is a substance needed by your body in small amounts. Your body makes all the cholesterol it needs. Excess cholesterol comes from the food you eat. When you have high levels of cholesterol and saturated fat in your blood, health problems can develop because the excess fat and cholesterol will gather along the walls of your blood vessels, causing them to narrow. Choosing the right foods will help you control your intake of fat and cholesterol. This will help keep the levels of these substances in your blood within normal limits and reduce your risk of disease. WHAT IS MY PLAN? Your health care provider recommends that you:  Get no more than __________ % of the total calories in your daily diet from fat.  Limit your intake of saturated fat to less than ______% of your total calories each day.  Limit the amount of cholesterol in your diet to less than _________mg per day. WHAT TYPES OF FAT SHOULD I CHOOSE?  Choose healthy fats more often. Choose monounsaturated and polyunsaturated fats, such as olive and canola oil, flaxseeds, walnuts, almonds, and seeds.  Eat more omega-3 fats. Good choices include salmon, mackerel, sardines, tuna, flaxseed oil, and ground flaxseeds. Aim to eat fish at least two times a week.  Limit saturated fats. Saturated fats are primarily found in animal products, such as meats, butter, and cream. Plant sources of saturated fats include palm oil, palm kernel oil, and coconut oil.  Avoid foods with partially hydrogenated oils in them. These contain trans fats. Examples of foods that contain trans fats are stick margarine, some tub  margarines, cookies, crackers, and other baked goods. WHAT GENERAL GUIDELINES DO I NEED TO FOLLOW? These guidelines for healthy eating will help you control your intake of fat and cholesterol:  Check food labels carefully to identify foods with trans fats or high amounts of saturated fat.  Fill one half of your plate with vegetables and green salads.  Fill one fourth of your plate with whole grains. Look for the word "whole" as the first word in the ingredient list.  Fill one fourth of your plate with lean protein foods.  Limit fruit to two servings a day. Choose fruit instead of juice.  Eat more foods that contain soluble fiber. Examples of foods that contain this type of fiber are apples, broccoli, carrots, beans, peas, and barley. Aim to get 20-30 g of fiber per day.  Eat more home-cooked food and less restaurant, buffet, and fast food.  Limit or avoid alcohol.  Limit foods high in starch and sugar.  Limit fried foods.  Cook foods using methods other than frying. Baking, boiling, grilling, and broiling are all great options.  Lose weight if you are overweight. Losing just 5-10% of your initial body weight can help your overall health and prevent diseases such as diabetes and heart disease. WHAT FOODS CAN I EAT? Grains Whole grains, such as whole wheat or whole grain breads, crackers, cereals, and pasta. Unsweetened oatmeal, bulgur, barley, quinoa, or brown rice. Corn or whole wheat flour tortillas. Vegetables Fresh or frozen vegetables (raw, steamed, roasted, or grilled). Green salads. Fruits All fresh, canned (in natural juice), or frozen fruits. Meat and  Other Protein Products Ground beef (85% or leaner), grass-fed beef, or beef trimmed of fat. Skinless chicken or [place]. Ground chicken or [place]. Pork trimmed of fat. All fish and seafood. Eggs. Dried beans, peas, or lentils. Unsalted nuts or seeds. Unsalted canned or dry beans. Dairy Low-fat dairy products, such as skim or  1% milk, 2% or reduced-fat cheeses, low-fat ricotta or cottage cheese, or plain low-fat yogurt. Fats and Oils Tub margarines without trans fats. Light or reduced-fat mayonnaise and salad dressings. Avocado. Olive, canola, sesame, or safflower oils. Natural peanut or almond butter (choose ones without added sugar and oil). The items listed above may not be a complete list of recommended foods or beverages. Contact your dietitian for more options. WHAT FOODS ARE NOT RECOMMENDED? Grains White bread. White pasta. White rice. Cornbread. Bagels, pastries, and croissants. Crackers that contain trans fat. Vegetables White potatoes. Corn. Creamed or fried vegetables. Vegetables in a cheese sauce. Fruits Dried fruits. Canned fruit in light or heavy syrup. Fruit juice. Meat and Other Protein Products Fatty cuts of meat. Ribs, chicken wings, bacon, sausage, bologna, salami, chitterlings, fatback, hot dogs, bratwurst, and packaged luncheon meats. Liver and organ meats. Dairy Whole or 2% milk, cream, half-and-half, and cream cheese. Whole milk cheeses. Whole-fat or sweetened yogurt. Full-fat cheeses. Nondairy creamers and whipped toppings. Processed cheese, cheese spreads, or cheese curds. Sweets and Desserts Corn syrup, sugars, honey, and molasses. Candy. Jam and jelly. Syrup. Sweetened cereals. Cookies, pies, cakes, donuts, muffins, and ice cream. Fats and Oils Butter, stick margarine, lard, shortening, ghee, or bacon fat. Coconut, palm kernel, or palm oils. Beverages Alcohol. Sweetened drinks (such as sodas, lemonade, and fruit drinks or punches). The items listed above may not be a complete list of foods and beverages to avoid. Contact your dietitian for more information.   This information is not intended to replace advice given to you by your health care provider. Make sure you discuss any questions you have with your health care provider.   Document Released: 06/23/2005 Document Revised: 07/14/2014  Document Reviewed: 09/21/2013 Elsevier Interactive Patient Education 2016 [employer] Diabetes Mellitus and Food It is important for you to manage your blood sugar (glucose) level. Your blood glucose level can be greatly affected by what you eat. Eating healthier foods in the appropriate amounts throughout the day at about the same time each day will help you control your blood glucose level. It can also help slow or prevent worsening of your diabetes mellitus. Healthy eating may even help you improve the level of your blood pressure and reach or maintain a healthy weight.  General recommendations for healthful eating and cooking habits include:  Eating meals and snacks regularly. Avoid going long periods of time without eating to lose weight.  Eating a diet that consists mainly of plant-based foods, such as fruits, vegetables, nuts, legumes, and whole grains.  Using low-heat cooking methods, such as baking, instead of high-heat cooking methods, such as deep frying. Work with your dietitian to make sure you understand how to use the Nutrition Facts information on food labels. HOW CAN FOOD AFFECT ME? Carbohydrates Carbohydrates affect your blood glucose level more than any other type of food. Your dietitian will help you determine how many carbohydrates to eat at each meal and teach you how to count carbohydrates. Counting carbohydrates is important to keep your blood glucose at a healthy level, especially if you are using insulin or taking certain medicines for diabetes mellitus. Alcohol Alcohol can cause sudden decreases in blood  glucose (hypoglycemia), especially if you use insulin or take certain medicines for diabetes mellitus. Hypoglycemia can be a life-threatening condition. Symptoms of hypoglycemia (sleepiness, dizziness, and disorientation) are similar to symptoms of having too much alcohol.  If your health care provider has given you approval to drink alcohol, do so in moderation and use  the following guidelines:  Women should not have more than one drink per day, and men should not have more than two drinks per day. One drink is equal to:  12 oz of beer.  5 oz of wine.  1 oz of hard liquor.  Do not drink on an empty stomach.  Keep yourself hydrated. Have water, diet soda, or unsweetened iced tea.  Regular soda, juice, and other mixers might contain a lot of carbohydrates and should be counted. WHAT FOODS ARE NOT RECOMMENDED? As you make food choices, it is important to remember that all foods are not the same. Some foods have fewer nutrients per serving than other foods, even though they might have the same number of calories or carbohydrates. It is difficult to get your body what it needs when you eat foods with fewer nutrients. Examples of foods that you should avoid that are high in calories and carbohydrates but low in nutrients include:  Trans fats (most processed foods list trans fats on the Nutrition Facts label).  Regular soda.  Juice.  Candy.  Sweets, such as cake, pie, doughnuts, and cookies.  Fried foods. WHAT FOODS CAN I EAT? Eat nutrient-rich foods, which will nourish your body and keep you healthy. The food you should eat also will depend on several factors, including:  The calories you need.  The medicines you take.  Your weight.  Your blood glucose level.  Your blood pressure level.  Your cholesterol level. You should eat a variety of foods, including:  Protein.  Lean cuts of meat.  Proteins low in saturated fats, such as fish, egg whites, and beans. Avoid processed meats.  Fruits and vegetables.  Fruits and vegetables that may help control blood glucose levels, such as apples, mangoes, and yams.  Dairy products.  Choose fat-free or low-fat dairy products, such as milk, yogurt, and cheese.  Grains, bread, pasta, and rice.  Choose whole grain products, such as multigrain bread, whole oats, and brown rice. These foods may  help control blood pressure.  Fats.  Foods containing healthful fats, such as nuts, avocado, olive oil, canola oil, and fish. DOES EVERYONE WITH DIABETES MELLITUS HAVE THE SAME MEAL PLAN? Because every person with diabetes mellitus is different, there is not one meal plan that works for everyone. It is very important that you meet with a dietitian who will help you create a meal plan that is just right for you.   This information is not intended to replace advice given to you by your health care provider. Make sure you discuss any questions you have with your health care provider.   Document Released: 03/20/2005 Document Revised: 07/14/2014 Document Reviewed: 05/20/2013 Elsevier Interactive Patient Education 2016 [employer]. Tips for Eating Away From Home If You Have Diabetes Controlling your level of blood glucose, also known as blood sugar, can be challenging. It can be even more difficult when you do not prepare your own meals. The following tips can help you manage your diabetes when you eat away from home. PLANNING AHEAD Plan ahead if you know you will be eating away from home:  Ask your health care provider how to time meals and  medicine if you are taking insulin.  Make a list of restaurants near you that offer healthy choices. If they have a carry-out menu, take it home and plan what you will order ahead of time.  Look up the restaurant you want to eat at online. Many chain and fast-food restaurants list nutritional information online. Use this information to choose the healthiest options and to calculate how many carbohydrates will be in your meal.  Use a carbohydrate-counting book or mobile app to look up the carbohydrate content and serving size of the foods you want to eat.  Become familiar with serving sizes and learn to recognize how many servings are in a portion. This will allow you to estimate how many carbohydrates you can eat. FREE FOODS A "free food" is any food or  drink that has less than 5 g of carbohydrates per serving. Free foods include:  Many vegetables.  Hard boiled eggs.  Nuts or seeds.  Olives.  Cheeses.  Meats. These types of foods make good appetizer choices and are often available at salad bars. Lemon juice, vinegar, or a low-calorie salad dressing of fewer than 20 calories per serving can be used as a "free" salad dressing.  CHOICES TO REDUCE CARBOHYDRATES  Substitute nonfat sweetened yogurt with a sugar-free yogurt. Yogurt made from soy milk may also be used, but you will still want a sugar-free or plain option to choose a lower carbohydrate amount.  Ask your server to take away the bread basket or chips from your table.  Order fresh fruit. A salad bar often offers fresh fruit choices. Avoid canned fruit because it is usually packed in sugar or syrup.  Order a salad, and eat it without dressing. Or, create a "free" salad dressing.  Ask for substitutions. For example, instead of [place] fries, request an order of a vegetable such as salad, green beans, or broccoli. OTHER TIPS   If you take insulin, take the insulin once your food arrives to your table. This will ensure your insulin and food are timed correctly.  Ask your server about the portion size before your order, and ask for a take-out box if the portion has more servings than you should have. When your food comes, leave the amount you should have on the plate, and put the rest in the take-out box.  Consider splitting an entree with someone and ordering a side salad.   This information is not intended to replace advice given to you by your health care provider. Make sure you discuss any questions you have with your health care provider.   Document Released: 06/23/2005 Document Revised: 03/14/2015 Document Reviewed: 09/20/2013 Elsevier Interactive Patient Education [employer] Elsevier Inc.

## 2015-06-02 ENCOUNTER — Encounter: Payer: Self-pay | Admitting: Family Medicine

## 2015-06-02 DIAGNOSIS — E119 Type 2 diabetes mellitus without complications: Secondary | ICD-10-CM | POA: Insufficient documentation

## 2015-06-02 DIAGNOSIS — E559 Vitamin D deficiency, unspecified: Secondary | ICD-10-CM | POA: Insufficient documentation

## 2015-06-02 LAB — VITAMIN D 25 HYDROXY (VIT D DEFICIENCY, FRACTURES): Vit D, 25-Hydroxy: 10 ng/mL — ABNORMAL LOW (ref 30–100)

## 2015-06-02 MED ORDER — ERGOCALCIFEROL 1.25 MG (50000 UT) PO CAPS: 50000.0000 [IU] | ORAL_CAPSULE | ORAL | Status: DC

## 2015-08-23 DIAGNOSIS — Z0100 Encounter for examination of eyes and vision without abnormal findings: Secondary | ICD-10-CM | POA: Diagnosis not present

## 2015-11-29 ENCOUNTER — Ambulatory Visit (INDEPENDENT_AMBULATORY_CARE_PROVIDER_SITE_OTHER): Payer: Commercial Managed Care - HMO | Admitting: Family Medicine

## 2015-11-29 ENCOUNTER — Encounter: Payer: Self-pay | Admitting: Family Medicine

## 2015-11-29 VITALS — BP 133/74 | HR 75 | Temp 97.8°F | Resp 16 | Ht 59.0 in | Wt 122.4 lb

## 2015-11-29 DIAGNOSIS — E785 Hyperlipidemia, unspecified: Secondary | ICD-10-CM | POA: Diagnosis not present

## 2015-11-29 DIAGNOSIS — E119 Type 2 diabetes mellitus without complications: Secondary | ICD-10-CM | POA: Diagnosis not present

## 2015-11-29 DIAGNOSIS — Z5181 Encounter for therapeutic drug level monitoring: Secondary | ICD-10-CM

## 2015-11-29 DIAGNOSIS — Z1239 Encounter for other screening for malignant neoplasm of breast: Secondary | ICD-10-CM | POA: Diagnosis not present

## 2015-11-29 DIAGNOSIS — D649 Anemia, unspecified: Secondary | ICD-10-CM | POA: Diagnosis not present

## 2015-11-29 DIAGNOSIS — E559 Vitamin D deficiency, unspecified: Secondary | ICD-10-CM

## 2015-11-29 DIAGNOSIS — Z23 Encounter for immunization: Secondary | ICD-10-CM

## 2015-11-29 DIAGNOSIS — Z780 Asymptomatic menopausal state: Secondary | ICD-10-CM

## 2015-11-29 DIAGNOSIS — F17200 Nicotine dependence, unspecified, uncomplicated: Secondary | ICD-10-CM | POA: Diagnosis not present

## 2015-11-29 DIAGNOSIS — M8580 Other specified disorders of bone density and structure, unspecified site: Secondary | ICD-10-CM

## 2015-11-29 LAB — MICROALBUMIN / CREATININE URINE RATIO
Creatinine, Urine: 63 mg/dL (ref 20–320)
Microalb Creat Ratio: 11 mcg/mg creat (ref <30)
Microalb, Ur: 0.7 mg/dL

## 2015-11-29 MED ORDER — ZOSTER VACCINE LIVE 19400 UNT/0.65ML ~~LOC~~ SUSR: 0.6500 mL | Freq: Once | SUBCUTANEOUS | Status: DC

## 2015-11-29 NOTE — Patient Instructions (Addendum)
You need to schedule you mammogram and your bone scan.  Please call the breast center at [facility] at [phone] to schedule  [facility] is now offering annual lung cancer screening by low-dose CT scan.  This is covered for qualifying patients and your insurance will be checked before the procedure.  Call the lung cancer screening nurse navigators at [phone] to learn more about this and get scheduled.       IF you received an x-ray today, you will receive an invoice from [facility]. Please contact [facility] at [phone] with questions or concerns regarding your invoice.   IF you received labwork today, you will receive an invoice from [employer]. Please contact [facility] at [phone] with questions or concerns regarding your invoice.   Our billing staff will not be able to assist you with questions regarding bills from these companies.  You will be contacted with the lab results as soon as they are available. The fastest way to get your results is to activate your My Chart account. Instructions are located on the last page of this paperwork. If you have not heard from [place] regarding the results in 2 weeks, please contact this office.       Why follow it? Research shows. . Those who follow the Mediterranean diet have a reduced risk of heart disease  . The diet is associated with a reduced incidence of Parkinson's and Alzheimer's diseases . People following the diet may have longer life expectancies and lower rates of chronic diseases  . The Dietary Guidelines for Americans recommends the Mediterranean diet as an eating plan to promote health and prevent disease  What Is the Mediterranean Diet?  . Healthy eating plan based on typical foods and recipes of Mediterranean-style cooking . The diet is primarily a plant based diet; these foods should make up a majority of meals   Starches - Plant based foods should make up a  majority of meals - They are an important sources of vitamins, minerals, energy, antioxidants, and fiber - Choose whole grains, foods high in fiber and minimally processed items  - Typical grain sources include wheat, oats, barley, corn, brown rice, bulgar, farro, millet, polenta, couscous  - Various types of beans include chickpeas, lentils, fava beans, black beans, white beans   Fruits  Veggies - Large quantities of antioxidant rich fruits & veggies; 6 or more servings  - Vegetables can be eaten raw or lightly drizzled with oil and cooked  - Vegetables common to the traditional Mediterranean Diet include: artichokes, arugula, beets, broccoli, brussel sprouts, cabbage, carrots, celery, collard greens, cucumbers, eggplant, kale, leeks, lemons, lettuce, mushrooms, okra, onions, peas, peppers, potatoes, pumpkin, radishes, rutabaga, shallots, spinach, sweet potatoes, turnips, zucchini - Fruits common to the Mediterranean Diet include: apples, apricots, avocados, cherries, clementines, dates, figs, grapefruits, grapes, melons, nectarines, oranges, peaches, pears, pomegranates, strawberries, tangerines  Fats - Replace butter and margarine with healthy oils, such as olive oil, canola oil, and tahini  - Limit nuts to no more than a handful a day  - Nuts include walnuts, almonds, pecans, pistachios, pine nuts  - Limit or avoid candied, honey roasted or heavily salted nuts - Olives are central to the [employer] - can be eaten whole or used in a variety of dishes   Meats Protein - Limiting red meat: no more than a few times a month - When eating red meat: choose lean cuts and keep the portion to the size of deck of  cards - Eggs: approx. 0 to 4 times a week  - Fish and lean poultry: at least 2 a week  - Healthy protein sources include, chicken, [place], lean beef, lamb - Increase intake of seafood such as tuna, salmon, trout, mackerel, shrimp, scallops - Avoid or limit high fat processed meats such  as sausage and bacon  Dairy - Include moderate amounts of low fat dairy products  - Focus on healthy dairy such as fat free yogurt, skim milk, low or reduced fat cheese - Limit dairy products higher in fat such as whole or 2% milk, cheese, ice cream  Alcohol - Moderate amounts of red wine is ok  - No more than 5 oz daily for women (all ages) and men older than age 64  - No more than 10 oz of wine daily for men younger than 76  Other - Limit sweets and other desserts  - Use herbs and spices instead of salt to flavor foods  - Herbs and spices common to the traditional Mediterranean Diet include: basil, bay leaves, chives, cloves, cumin, fennel, garlic, lavender, marjoram, mint, oregano, parsley, pepper, rosemary, sage, savory, sumac, tarragon, thyme   It's not just a diet, it's a lifestyle:  . The Mediterranean diet includes lifestyle factors typical of those in the region  . Foods, drinks and meals are best eaten with others and savored . Daily physical activity is important for overall good health . This could be strenuous exercise like running and aerobics . This could also be more leisurely activities such as walking, housework, yard-work, or taking the stairs . Moderation is the key; a balanced and healthy diet accommodates most foods and drinks . Consider portion sizes and frequency of consumption of certain foods   Meal Ideas & Options:  . Breakfast:  o Whole wheat toast or whole wheat English muffins with peanut butter & hard boiled egg o Steel cut oats topped with apples & cinnamon and skim milk  o Fresh fruit: banana, strawberries, melon, berries, peaches  o Smoothies: strawberries, bananas, greek yogurt, peanut butter o Low fat greek yogurt with blueberries and granola  o Egg white omelet with spinach and mushrooms o Breakfast couscous: whole wheat couscous, apricots, skim milk, cranberries  . Sandwiches:  o Hummus and grilled vegetables (peppers, zucchini, squash) on whole  wheat bread   o Grilled chicken on whole wheat pita with lettuce, tomatoes, cucumbers or tzatziki  o Tuna salad on whole wheat bread: tuna salad made with greek yogurt, olives, red peppers, capers, green onions o Garlic rosemary lamb pita: lamb sauted with garlic, rosemary, salt & pepper; add lettuce, cucumber, greek yogurt to pita - flavor with lemon juice and black pepper  . Seafood:  o Mediterranean grilled salmon, seasoned with garlic, basil, parsley, lemon juice and black pepper o Shrimp, lemon, and spinach whole-grain pasta salad made with low fat greek yogurt  o Seared scallops with lemon orzo  o Seared tuna steaks seasoned salt, pepper, coriander topped with tomato mixture of olives, tomatoes, olive oil, minced garlic, parsley, green onions and cappers  . Meats:  o Herbed greek chicken salad with kalamata olives, cucumber, feta  o Red bell peppers stuffed with spinach, bulgur, lean ground beef (or lentils) & topped with feta   o Kebabs: skewers of chicken, tomatoes, onions, zucchini, squash  o [place] burgers: made with red onions, mint, dill, lemon juice, feta cheese topped with roasted red peppers . Vegetarian o Cucumber salad: cucumbers, artichoke hearts, celery, red onion,  feta cheese, tossed in olive oil & lemon juice  o Hummus and whole grain pita points with a greek salad (lettuce, tomato, feta, olives, cucumbers, red onion) o Lentil soup with celery, carrots made with vegetable broth, garlic, salt and pepper  o Tabouli salad: parsley, bulgur, mint, scallions, cucumbers, tomato, radishes, lemon juice, olive oil, salt and pepper.

## 2015-11-29 NOTE — Progress Notes (Signed)
Subjective:    Patient ID: Sharon Peterson, female    DOB: 09-27-1950, 65 y.o.   MRN: [id number] Chief Complaint  Patient presents with  . Hyperlipidemia    follow up 6 mths.  . Cough    2 wks    HPI  Sharon Peterson is a delightful 65 yo woman who is here today for a 6 month follow-up on her chronic medical conditions.  Type 2 DM: a1c was 6.6 -> 6.3 6 mos prior ->.  Has been working on diet so weight  Osteopenia: Dexa done 12/2013 with Lt femur T score -1.3, had vitamin D def of 10, on vitamin D supplement at. Finished high dose replacement for 6 months HPL: On lipitor 40. Goal LDL <100 Tob use: At last visit had gone from 1 ppd-> 2/3 ppd -> 1/2 ppd Pre-DM:   67 yo great-grandson   Past Medical History  Diagnosis Date  . Osteopenia of the elderly 12/16/2013    T score -1.3 at left femur   No past surgical history on file. Current Outpatient Prescriptions on File Prior to Visit  Medication Sig Dispense Refill  . atorvastatin (LIPITOR) 40 MG tablet TAKE 1 TABLET EVERY DAY.  "OV NEEDED FOR ADDITIONAL REFILLS" (Patient not taking: Reported on 11/29/2015) 30 tablet 0  . ergocalciferol (VITAMIN D2) 50000 UNITS capsule Take 1 capsule (50,000 Units total) by mouth once a week. (Patient not taking: Reported on 11/29/2015) 12 capsule 1  . OVER THE COUNTER MEDICATION Reported on 11/29/2015     No current facility-administered medications on file prior to visit.   Not on File No family history on file. Social History   Social History  . Marital Status: Single    Spouse Name: N/A  . Number of Children: N/A  . Years of Education: N/A   Social History Main Topics  . Smoking status: Current Every Day Smoker  . Smokeless tobacco: None  . Alcohol Use: No  . Drug Use: No  . Sexual Activity: Not Asked   Other Topics Concern  . None   Social History Narrative   Significant other. Education: [employer].   Depression screen [facility] 2/9 11/29/2015 06/01/2015 12/09/2013  Decreased Interest 0  0 0  Down, Depressed, Hopeless 0 0 0  PHQ - 2 Score 0 0 0      Review of Systems  Constitutional: Negative for fever, chills, diaphoresis, activity change, appetite change, fatigue and unexpected weight change.  Eyes: Negative for visual disturbance.  Respiratory: Positive for cough. Negative for shortness of breath.   Cardiovascular: Negative for chest pain, palpitations and leg swelling.  Genitourinary: Negative for decreased urine volume.  Neurological: Negative for syncope and headaches.  Hematological: Does not bruise/bleed easily.  Psychiatric/Behavioral: Negative for dysphoric mood. The patient is not nervous/anxious.        Objective:  BP 133/74 mmHg  Pulse 75  Temp(Src) 97.8 F (36.6 C) (Oral)  Resp 16  Ht  (1.499 m)  Wt 122 lb 6.4 oz (55.52 kg)  BMI 24.71 kg/m2  Physical Exam  Constitutional: She is oriented to person, place, and time. She appears well-developed and well-nourished. No distress.  HENT:  Head: Normocephalic and atraumatic.  Right Ear: External ear normal.  Left Ear: External ear normal.  Eyes: Conjunctivae are normal. No scleral icterus.  Neck: Normal range of motion. Neck supple. No thyromegaly present.  Cardiovascular: Normal rate, regular rhythm, normal heart sounds and intact distal pulses.   Pulmonary/Chest: Effort normal and  breath sounds normal. No respiratory distress.  Musculoskeletal: She exhibits no edema.  Lymphadenopathy:    She has no cervical adenopathy.  Neurological: She is alert and oriented to person, place, and time.  Skin: Skin is warm and dry. She is not diaphoretic. No erythema.  Psychiatric: She has a normal mood and affect. Her behavior is normal.          Assessment & Plan:  Last CPE was 05/2013 so did not have her WTM visit - Sched initial AWV Osteopenia - call Breast Center to schedule at same time as mammogram in August 2017 Tob use: advised calling for lung cancer screening Check: vit D, cmp, a1c, flp,  u microalb, tsh, cbc Needs prevnar-13 1. Osteopenia   2. Type 2 diabetes mellitus without complication, without long-term current use of insulin (HCC)   3. Hyperlipidemia   4. Tobacco use disorder   5. Medication monitoring encounter   6. Screening for breast cancer   7. Postmenopausal estrogen deficiency   8. Anemia, unspecified anemia type   9. Vitamin D deficiency   10. Need for prophylactic vaccination against Streptococcus pneumoniae (pneumococcus)   Pt was fasting but due to confusion (I though she had already had her labs drawn) she left before she had her blood taken. Pt called - she had already eaten but agreed to RTC at some other time for lab only visit - please make sure pt does not have a copay for the lab-only visit. Of note, pt did have a very odd appearance and demeanor today that was noted independently by myself, nurse, and lab tech - she was weaving her upper body around, smacking and licking her lips.  Pt not on any psych med that I am aware of but she does have a history of polysubstance abuse. Pt was very agreeable during office visit but seemed to be in a hurry - wanted to get breakfast and get started on her day so was pleasant but not to interested in discussing her medical care - it seemed she just wanted to get her chronic med refills and take off asap.  did not mention her cough at all (which was later seen by myself on chief complaint)  Orders Placed This Encounter  Procedures  . DG Bone Density    Standing Status: Future     Number of Occurrences:      Standing Expiration Date: 01/28/2017    Order Specific Question:  Reason for Exam (SYMPTOM  OR DIAGNOSIS REQUIRED)    Answer:  follow-up osteopenia    Order Specific Question:  [facility]?    [facility]  . MM Digital Screening    Standing Status: Future     Number of Occurrences:      Standing Expiration Date: 01/28/2017    Order Specific Question:  Reason for Exam (SYMPTOM  OR  DIAGNOSIS REQUIRED)    Answer:  screening    Order Specific Question:  Preferred imaging location?    [facility]  . Pneumococcal conjugate vaccine 13-valent IM  . Microalbumin/Creatinine Ratio, Urine  . Comprehensive metabolic panel    Standing Status: Future     Number of Occurrences:      Standing Expiration Date: 11/28/2016  . CBC    Standing Status: Future     Number of Occurrences:      Standing Expiration Date: 11/28/2016  . VITAMIN D 25 Hydroxy (Vit-D Deficiency, Fractures)    Standing Status: Future  Number of Occurrences:      Standing Expiration Date: 11/28/2016  . TSH    Standing Status: Future     Number of Occurrences:      Standing Expiration Date: 11/28/2016  . Lipid panel    Standing Status: Future     Number of Occurrences:      Standing Expiration Date: 11/28/2016  . Hemoglobin A1c    Standing Status: Future     Number of Occurrences:      Standing Expiration Date: 11/28/2016    Meds ordered this encounter  Medications  . Zoster Vaccine Live, PF, (ZOSTAVAX) 16109 UNT/0.65ML injection    Sig: Inject 19,400 Units into the skin once.    Dispense:  1 vial    Refill:  0     Norberto Sorenson, MD MPH

## 2016-02-19 ENCOUNTER — Ambulatory Visit: Payer: Medicare HMO

## 2016-02-21 ENCOUNTER — Telehealth: Payer: Self-pay

## 2016-02-21 NOTE — Telephone Encounter (Signed)
Patient have a question about getting a coloscopy. Patient asked she can have it done at home?????? [phone].

## 2016-02-22 NOTE — Telephone Encounter (Signed)
Left message for pt to call back  °

## 2016-03-04 ENCOUNTER — Ambulatory Visit
Admission: RE | Admit: 2016-03-04 | Discharge: 2016-03-04 | Disposition: A | Discharge status: Home / Self Care | Payer: Commercial Managed Care - HMO | Source: Ambulatory Visit | Attending: Family Medicine | Admitting: Family Medicine

## 2016-03-04 DIAGNOSIS — Z1231 Encounter for screening mammogram for malignant neoplasm of breast: Secondary | ICD-10-CM | POA: Diagnosis not present

## 2016-03-04 DIAGNOSIS — Z1239 Encounter for other screening for malignant neoplasm of breast: Secondary | ICD-10-CM

## 2016-04-30 LAB — FECAL OCCULT BLOOD, GUAIAC: FECAL OCCULT BLD: NEGATIVE

## 2016-05-22 ENCOUNTER — Ambulatory Visit (INDEPENDENT_AMBULATORY_CARE_PROVIDER_SITE_OTHER): Payer: Commercial Managed Care - HMO | Admitting: Family Medicine

## 2016-05-22 ENCOUNTER — Encounter: Payer: Self-pay | Admitting: Family Medicine

## 2016-05-22 VITALS — BP 120/60 | HR 71 | Temp 97.6°F | Resp 18 | Ht 59.0 in | Wt 125.4 lb

## 2016-05-22 DIAGNOSIS — Z23 Encounter for immunization: Secondary | ICD-10-CM

## 2016-05-22 DIAGNOSIS — Z1231 Encounter for screening mammogram for malignant neoplasm of breast: Secondary | ICD-10-CM | POA: Diagnosis not present

## 2016-05-22 DIAGNOSIS — Z1211 Encounter for screening for malignant neoplasm of colon: Secondary | ICD-10-CM

## 2016-05-22 DIAGNOSIS — M8580 Other specified disorders of bone density and structure, unspecified site: Secondary | ICD-10-CM | POA: Diagnosis not present

## 2016-05-22 DIAGNOSIS — Z5181 Encounter for therapeutic drug level monitoring: Secondary | ICD-10-CM

## 2016-05-22 DIAGNOSIS — Z0000 Encounter for general adult medical examination without abnormal findings: Secondary | ICD-10-CM | POA: Diagnosis not present

## 2016-05-22 DIAGNOSIS — D649 Anemia, unspecified: Secondary | ICD-10-CM | POA: Diagnosis not present

## 2016-05-22 DIAGNOSIS — E785 Hyperlipidemia, unspecified: Secondary | ICD-10-CM

## 2016-05-22 DIAGNOSIS — E559 Vitamin D deficiency, unspecified: Secondary | ICD-10-CM | POA: Diagnosis not present

## 2016-05-22 DIAGNOSIS — E119 Type 2 diabetes mellitus without complications: Secondary | ICD-10-CM

## 2016-05-22 DIAGNOSIS — Z1389 Encounter for screening for other disorder: Secondary | ICD-10-CM

## 2016-05-22 DIAGNOSIS — Z1383 Encounter for screening for respiratory disorder NEC: Secondary | ICD-10-CM

## 2016-05-22 DIAGNOSIS — Z136 Encounter for screening for cardiovascular disorders: Secondary | ICD-10-CM | POA: Diagnosis not present

## 2016-05-22 DIAGNOSIS — Z1212 Encounter for screening for malignant neoplasm of rectum: Secondary | ICD-10-CM

## 2016-05-22 LAB — CBC
HEMATOCRIT: 35.2 % (ref 35.0–45.0)
HEMOGLOBIN: 11.5 g/dL — AB (ref 11.7–15.5)
MCH: 27.1 pg (ref 27.0–33.0)
MCHC: 32.7 g/dL (ref 32.0–36.0)
MCV: 83 fL (ref 80.0–100.0)
MPV: 9.1 fL (ref 7.5–12.5)
Platelets: 286 10*3/uL (ref 140–400)
RBC: 4.24 MIL/uL (ref 3.80–5.10)
RDW: 16.7 % — ABNORMAL HIGH (ref 11.0–15.0)
WBC: 7.2 10*3/uL (ref 3.8–10.8)

## 2016-05-22 LAB — POCT URINALYSIS DIP (MANUAL ENTRY)
BILIRUBIN UA: NEGATIVE
Bilirubin, UA: NEGATIVE
Blood, UA: NEGATIVE
Glucose, UA: NEGATIVE
NITRITE UA: NEGATIVE
PH UA: 6
PROTEIN UA: NEGATIVE
Spec Grav, UA: <=1.005
Urobilinogen, UA: 0.2

## 2016-05-22 LAB — COMPREHENSIVE METABOLIC PANEL
ALT: 8 U/L (ref 6–29)
AST: 16 U/L (ref 10–35)
Albumin: 4.4 g/dL (ref 3.6–5.1)
Alkaline Phosphatase: 76 U/L (ref 33–130)
BUN: 16 mg/dL (ref 7–25)
CO2: 19 mmol/L — AB (ref 20–31)
Calcium: 9.4 mg/dL (ref 8.6–10.4)
Chloride: 108 mmol/L (ref 98–110)
Creat: 1.06 mg/dL — ABNORMAL HIGH (ref 0.50–0.99)
GLUCOSE: 120 mg/dL — AB (ref 65–99)
POTASSIUM: 4.3 mmol/L (ref 3.5–5.3)
Sodium: 136 mmol/L (ref 135–146)
Total Bilirubin: 0.3 mg/dL (ref 0.2–1.2)
Total Protein: 7.3 g/dL (ref 6.1–8.1)

## 2016-05-22 LAB — LIPID PANEL
CHOL/HDL RATIO: 6.4 ratio — AB (ref <5.0)
Cholesterol: 293 mg/dL — ABNORMAL HIGH (ref <200)
HDL: 46 mg/dL — AB (ref >50)
LDL CALC: 208 mg/dL — AB (ref <100)
Triglycerides: 195 mg/dL — ABNORMAL HIGH (ref <150)
VLDL: 39 mg/dL — AB (ref <30)

## 2016-05-22 LAB — TSH: TSH: 1.21 mIU/L

## 2016-05-22 LAB — HEMOGLOBIN A1C
HEMOGLOBIN A1C: 6.1 % — AB (ref <5.7)
Mean Plasma Glucose: 128 mg/dL

## 2016-05-22 NOTE — Patient Instructions (Signed)
     IF you received an x-ray today, you will receive an invoice from [facility]. Please contact [facility] at [phone] with questions or concerns regarding your invoice.   IF you received labwork today, you will receive an invoice from [facility]. Please contact [facility] at [phone] with questions or concerns regarding your invoice.   Our billing staff will not be able to assist you with questions regarding bills from these companies.  You will be contacted with the lab results as soon as they are available. The fastest way to get your results is to activate your My Chart account. Instructions are located on the last page of this paperwork. If you have not heard from us regarding the results in 2 weeks, please contact this office.      

## 2016-05-22 NOTE — Progress Notes (Signed)
Subjective:    Sharon Peterson is a 65 y.o. female who presents for Medicare Initial preventive examination.  Preventive Screening-Counseling & Management  Tobacco History  Smoking Status  . Current Every Day Smoker  Smokeless Tobacco  . Not on file     Problems Prior to Visit 1. Tobacco abuse: At last visit had gone from 1 ppd-> 2/3 ppd -> 1/2 ppd -  Tries not to smoke in the house around her grandon. 2. Type 2 DM: a1c was 6.6 -> 6.3 last year.  Has stopped working on diet. 3. Osteopenia: Dexa done 12/2013 with Lt femur T score -1.3, had vitamin D def of 10, on vitamin D supplement at. Finished high dose replacement for 6 months. Not much calcium in diet and no suppelemnt 4.  HPL: On lipitor 40. Goal LDL <100 - has been 2.5 years since last fasting labs. Takes fish oil  Watches grand-son 5 days a week - he is almost 2 yo  States she is "going to her meetings"  Current Problems (verified) Patient Active Problem List   Diagnosis Date Noted  . Vitamin D deficiency 06/02/2015  . Type 2 diabetes mellitus without complication, without long-term current use of insulin (HCC) 06/02/2015  . Osteopenia 06/01/2015  . Hyperlipidemia 06/04/2013  . Tobacco use disorder 06/04/2013    Medications Prior to Visit Current Outpatient Prescriptions on File Prior to Visit  Medication Sig Dispense Refill  . atorvastatin (LIPITOR) 40 MG tablet TAKE 1 TABLET EVERY DAY.  "OV NEEDED FOR ADDITIONAL REFILLS" (Patient not taking: Reported on 11/29/2015) 30 tablet 0  . ergocalciferol (VITAMIN D2) 50000 UNITS capsule Take 1 capsule (50,000 Units total) by mouth once a week. (Patient not taking: Reported on 11/29/2015) 12 capsule 1  . OVER THE COUNTER MEDICATION Reported on 11/29/2015    . Zoster Vaccine Live, PF, (ZOSTAVAX) 40981 UNT/0.65ML injection Inject 19,400 Units into the skin once. 1 vial 0   No current facility-administered medications on file prior to visit.     Current Medications  (verified) Current Outpatient Prescriptions  Medication Sig Dispense Refill  . atorvastatin (LIPITOR) 40 MG tablet TAKE 1 TABLET EVERY DAY.  "OV NEEDED FOR ADDITIONAL REFILLS" (Patient not taking: Reported on 11/29/2015) 30 tablet 0  . ergocalciferol (VITAMIN D2) 50000 UNITS capsule Take 1 capsule (50,000 Units total) by mouth once a week. (Patient not taking: Reported on 11/29/2015) 12 capsule 1  . OVER THE COUNTER MEDICATION Reported on 11/29/2015    . Zoster Vaccine Live, PF, (ZOSTAVAX) 19147 UNT/0.65ML injection Inject 19,400 Units into the skin once. 1 vial 0   No current facility-administered medications for this visit.      Allergies (verified) Patient has no allergy information on record.   PAST HISTORY  Family History No family history on file.  Social History Social History  Substance Use Topics  . Smoking status: Current Every Day Smoker  . Smokeless tobacco: Not on file  . Alcohol use No     Are there smokers in your home (other than you)? No  Risk Factors Current exercise habits: The patient has a physically strenuous job, but has no regular exercise apart from work.  Gets walking in  Dietary issues discussed: not much calcium in diet.  Cardiac risk factors: advanced age (older than 51 for men, 11 for women) and dyslipidemia.  Depression Screen Depression screen [facility] 2/9 05/22/2016 11/29/2015 06/01/2015 12/09/2013  Decreased Interest 0 0 0 0  Down, Depressed, Hopeless 0 0 0 0  PHQ -  2 Score 0 0 0 0   Activities of Daily Living In your present state of health, do you have any difficulty performing the following activities?:  Driving? No Managing money?  No Feeding yourself? No Getting from bed to chair? No Climbing a flight of stairs? No Preparing food and eating?: No Bathing or showering? No Getting dressed: No Getting to the toilet? No Using the toilet:No Moving around from place to place: No In the past year have you fallen or had a near fall?:No   Are  you sexually active?  No  Do you have more than one partner?  No  Hearing Difficulties: No Do you often ask people to speak up or repeat themselves? No Do you experience ringing or noises in your ears? No Do you have difficulty understanding soft or whispered voices? No    Do you feel that you have a problem with memory? Yes - short term but it comes back to her quickly.  Do you often misplace items? Yes  Do you feel safe at home?  Yes  Cognitive Testing  Alert? Yes  Normal Appearance?Yes  Oriented to person? Yes  Place? Yes   Time? Yes  Recall of three objects?  Yes  Can perform simple calculations? Yes  Displays appropriate judgment?Yes  Can read the correct time from a watch face?Yes   Mild short-term problem   Advanced Directives have been discussed with the patient? Yes - daughter Almira Coaster is the oldest and she would be HCPOA. Pt is ready to go when her time, she has her creamation paid for.  List the Names of Other Physician/Practitioners you currently use: 1.   Dentures 2.   Sees opto annually told she has cataracts.  Indicate any recent Medical Services you may have received from other than [facility] providers in the past year (date may be approximate).  Immunization History  Administered Date(s) Administered  . Influenza,inj,Quad PF,36+ Mos 05/27/2013, 06/01/2015  . Pneumococcal Conjugate-13 11/29/2015  . Tdap 07/29/2013    Screening Tests Health Maintenance  Topic Date Due  . FOOT EXAM  10/24/1958  . OPHTHALMOLOGY EXAM  10/24/1958  . COLONOSCOPY  10/24/1998  . ZOSTAVAX  10/23/2008  . HEMOGLOBIN A1C  11/29/2015  . INFLUENZA VACCINE  02/05/2016  . URINE MICROALBUMIN  11/28/2016  . PNA vac Low Risk Adult (2 of 2 - PPSV23) 11/28/2016  . MAMMOGRAM  03/04/2018  . TETANUS/TDAP  07/30/2023  . DEXA SCAN  Completed  . Hepatitis C Screening  Completed    All answers were reviewed with the patient and necessary referrals were made:  Chrysa Rampy, MD   05/22/2016   History  reviewed: allergies, current medications, past family history, past medical history, past social history, past surgical history and problem list  Review of Systems Pertinent items noted in HPI and remainder of comprehensive ROS otherwise negative.    Objective:  BP 120/60   Pulse 71   Temp 97.6 F (36.4 C) (Oral)   Resp 18   Ht 4\' 11"  (1.499 m)   Wt 125 lb 6.4 oz (56.9 kg)   SpO2 100%   BMI 25.33 kg/m   Visual Acuity Screening   Right eye Left eye Both eyes  Without correction: 20/40-1 20/50-1 20/40-1  With correction:       BP 120/60   Pulse 71   Temp 97.6 F (36.4 C) (Oral)   Resp 18   Ht 4\' 11"  (1.499 m)   Wt 125 lb 6.4 oz (56.9 kg)  SpO2 100%   BMI 25.33 kg/m   General Appearance:    Alert, cooperative, no distress, appears stated age  Head:    Normocephalic, without obvious abnormality, atraumatic  Eyes:    PERRL, conjunctiva/corneas clear, EOM's intact, fundi    benign, both eyes  Ears:    Normal TM's and external ear canals, both ears  Nose:   Nares normal, septum midline, mucosa normal, no drainage    or sinus tenderness  Throat:   Lips, mucosa, and tongue normal; teeth and gums normal  Neck:   Supple, symmetrical, trachea midline, no adenopathy;    thyroid:  no enlargement/tenderness/nodules; no carotid   bruit or JVD  Back:     Symmetric, no curvature, ROM normal, no CVA tenderness  Lungs:     Clear to auscultation bilaterally, respirations unlabored  Chest Wall:    No tenderness or deformity   Heart:    Regular rate and rhythm, S1 and S2 normal, no murmur, rub   or gallop  Breast Exam:    No tenderness, masses, or nipple abnormality  Abdomen:     Soft, non-tender, bowel sounds active all four quadrants,    no masses, no organomegaly  Genitalia:    Normal female without lesion, discharge or tenderness  Rectal:    Normal tone, normal prostate, no masses or tenderness;   guaiac negative stool  Extremities:   Extremities normal, atraumatic, no cyanosis or  edema  Pulses:   2+ and symmetric all extremities  Skin:   Skin color, texture, turgor normal, no rashes or lesions  Lymph nodes:   Cervical, supraclavicular, and axillary nodes normal  Neurologic:   CNII-XII intact, normal strength, sensation and reflexes    throughout    Assessment:   1. Medicare annual wellness visit, initial   2. Screening for cardiovascular, respiratory, and genitourinary diseases   3. Encounter for screening mammogram for breast cancer   4. Screening for colorectal cancer   5. Type 2 diabetes mellitus without complication, without long-term current use of insulin (HCC) -   6. Anemia -   7. Osteopenia -   8. Vitamin D deficiency   9. Medication monitoring encounter -   10. Hyperlipidemia   11. Flu vaccine need      Plan:   flu shot done today, foot exam,  Seen by optho annually on [place] -  Send Zostavax prescription  Having a lot of pain on posterior left glut and has to take ibuprofen 4 before she walks.  It radiates    During the course of the visit the patient was educated and counseled about appropriate screening and preventive services including:     Immunizations: Needs flu shot. Will need Pneumovax 23 after May 2018. Needs Zostavax - prescription was sent to pharmacy 6 months prior. tdap up to date in 2015.  Colorectal cancer screening:   Mammogram: Done August 2017 normal at [facility] Center, done annually  Cervical cancer screening: Aged out  Bone density screening: [facility] showed osteopenia with T score -1.3 left femur neck. Patient due for repeat which has been ordered. Patient what instructed to have DEXA scan done in August at same time as mammogram at the breast center but this did not happen despite patient instruction and order.  Missed [facility] visit so has not had a screening EKG. No prior EKG on file.  Lung cancer screening: Patient still actively smoking and so would qualify. Encouraged patient to call  prior but  she did not so will place referral.  Diet review for nutrition referral? Yes ____  Not Indicated __x__   Patient Instructions (the written plan) was given to the patient.  Medicare Attestation I have personally reviewed: The patient's medical and social history Their use of alcohol, tobacco or illicit drugs Their current medications and supplements The patient's functional ability including ADLs,fall risks, home safety risks, cognitive, and hearing and visual impairment Diet and physical activities Evidence for depression or mood disorders  The patient's weight, height, BMI, and visual acuity have been recorded in the chart.  I have made referrals, counseling, and provided education to the patient based on review of the above and I have provided the patient with a written personalized care plan for preventive services.     Norberto SorensonSHAW,Kyair Ditommaso, MD   05/22/2016

## 2016-05-23 LAB — VITAMIN D 25 HYDROXY (VIT D DEFICIENCY, FRACTURES): Vit D, 25-Hydroxy: 15 ng/mL — ABNORMAL LOW (ref 30–100)

## 2016-05-29 ENCOUNTER — Encounter: Payer: Commercial Managed Care - HMO | Admitting: Family Medicine

## 2016-10-22 DIAGNOSIS — H524 Presbyopia: Secondary | ICD-10-CM | POA: Diagnosis not present

## 2016-11-26 ENCOUNTER — Ambulatory Visit (INDEPENDENT_AMBULATORY_CARE_PROVIDER_SITE_OTHER): Payer: Medicare HMO | Admitting: Family Medicine

## 2016-11-26 ENCOUNTER — Ambulatory Visit (INDEPENDENT_AMBULATORY_CARE_PROVIDER_SITE_OTHER): Payer: Medicare HMO

## 2016-11-26 VITALS — BP 155/70 | HR 75 | Temp 97.7°F | Resp 16 | Ht 60.5 in | Wt 130.0 lb

## 2016-11-26 DIAGNOSIS — Z720 Tobacco use: Secondary | ICD-10-CM

## 2016-11-26 DIAGNOSIS — R030 Elevated blood-pressure reading, without diagnosis of hypertension: Secondary | ICD-10-CM | POA: Diagnosis not present

## 2016-11-26 DIAGNOSIS — E559 Vitamin D deficiency, unspecified: Secondary | ICD-10-CM

## 2016-11-26 DIAGNOSIS — E119 Type 2 diabetes mellitus without complications: Secondary | ICD-10-CM

## 2016-11-26 DIAGNOSIS — R410 Disorientation, unspecified: Secondary | ICD-10-CM | POA: Diagnosis not present

## 2016-11-26 DIAGNOSIS — E7800 Pure hypercholesterolemia, unspecified: Secondary | ICD-10-CM

## 2016-11-26 DIAGNOSIS — R5383 Other fatigue: Secondary | ICD-10-CM

## 2016-11-26 LAB — POCT CBC
GRANULOCYTE PERCENT: 50 % (ref 37–80)
HCT, POC: 34.6 % — AB (ref 37.7–47.9)
HEMOGLOBIN: 11.6 g/dL — AB (ref 12.2–16.2)
Lymph, poc: 3.7 — AB (ref 0.6–3.4)
MCH: 27.5 pg (ref 27–31.2)
MCHC: 33.6 g/dL (ref 31.8–35.4)
MCV: 82.1 fL (ref 80–97)
MID (cbc): 0.3 (ref 0–0.9)
MPV: 7 fL (ref 0–99.8)
PLATELET COUNT, POC: 305 10*3/uL (ref 142–424)
POC Granulocyte: 4 (ref 2–6.9)
POC LYMPH PERCENT: 46.4 %L (ref 10–50)
POC MID %: 3.6 %M (ref 0–12)
RBC: 4.22 M/uL (ref 4.04–5.48)
RDW, POC: 15.3 %
WBC: 8 10*3/uL (ref 4.6–10.2)

## 2016-11-26 LAB — POCT URINALYSIS DIP (MANUAL ENTRY)
Bilirubin, UA: NEGATIVE
Glucose, UA: NEGATIVE mg/dL
Ketones, POC UA: NEGATIVE mg/dL
NITRITE UA: NEGATIVE
PH UA: 6 (ref 5.0–8.0)
PROTEIN UA: NEGATIVE mg/dL
RBC UA: NEGATIVE
Spec Grav, UA: <=1.005 — AB (ref 1.010–1.025)
Urobilinogen, UA: 0.2 E.U./dL

## 2016-11-26 LAB — POC MICROSCOPIC URINALYSIS (UMFC): Mucus: ABSENT

## 2016-11-26 LAB — POCT GLYCOSYLATED HEMOGLOBIN (HGB A1C): Hemoglobin A1C: 6.6

## 2016-11-26 MED ORDER — BLOOD GLUCOSE MONITOR KIT: PACK | 0 refills | Status: DC

## 2016-11-26 MED ORDER — ERGOCALCIFEROL 1.25 MG (50000 UT) PO CAPS: 50000.0000 [IU] | ORAL_CAPSULE | ORAL | 1 refills | Status: DC

## 2016-11-26 MED ORDER — LISINOPRIL 5 MG PO TABS: 5.0000 mg | ORAL_TABLET | Freq: Every day | ORAL | 3 refills | Status: DC

## 2016-11-26 MED ORDER — IPRATROPIUM BROMIDE 0.02 % IN SOLN
0.5000 mg | Freq: Once | RESPIRATORY_TRACT | Status: AC
  Administered 2016-11-26: 0.5 mg via RESPIRATORY_TRACT

## 2016-11-26 MED ORDER — ALBUTEROL SULFATE (2.5 MG/3ML) 0.083% IN NEBU
2.5000 mg | INHALATION_SOLUTION | Freq: Once | RESPIRATORY_TRACT | Status: AC
  Administered 2016-11-26: 2.5 mg via RESPIRATORY_TRACT

## 2016-11-26 MED ORDER — ATORVASTATIN CALCIUM 40 MG PO TABS: ORAL_TABLET | ORAL | 3 refills | Status: DC

## 2016-11-26 MED ORDER — ALBUTEROL SULFATE HFA 108 (90 BASE) MCG/ACT IN AERS: 2.0000 | INHALATION_SPRAY | RESPIRATORY_TRACT | 1 refills | Status: DC | PRN

## 2016-11-26 NOTE — Progress Notes (Signed)
shay 

## 2016-11-26 NOTE — Progress Notes (Signed)
Subjective:  By signing my name below, I, Sharon Peterson, attest that this documentation has been prepared under the direction and in the presence of Delman Cheadle, MD Electronically Signed: Ladene Artist, ED Scribe 11/26/2016 at 4:10 PM.   Patient ID: Sharon Peterson, female    DOB: Oct 20, 1950, 66 y.o.   MRN: [id number]  Chief Complaint  Patient presents with  . Fatigue    x1 1/2 weeks, PT feels "just tired"  . Altered Mental Status    x1 1/2 weeks, "Sometimes doesn't know what day it is"   HPI Sharon Peterson is a 66 y.o. female who presents to Primary Care at [facility]. Last saw pt 6 months prior at her annual wellness visit. Today, she presents with multiple complaints. Pt states that her sister-in-law, who was her best friend passed approximately 2 weeks ago. She states that another friend passed when she returned from her funeral. She states that she is unsure if she is depressed. However, pt states that she has been experiencing fatigue since their deaths, even after a night of sleep. She states that she usually gets up around 5 AM but recently has been getting up around 3 AM but think it's around 6 AM. She also reports that she has been losing track of her days. Pt also reports decreased patience and a nonchalant attitude. She states that she just doesn't have a care in the world anymore and just doesn't have the drive to "get up and go." She further reports that she has been experiencing constant "cotton mouth", feeling winded with walking up hills, worsened urinary leaking for which she wears a liner and a change in her taste buds. Pt states that she used to smoke 1.5 ppd but now she does not like the taste. She has taken ibuprofen for chronic left lateral hip pain but states that she has not taken any other medications or vitamins. Pt denies night sweats, unexpected weight loss, visual changes, chest pain, palpitations, difficulty urinating, changes in bowels, unsteadiness.   Past Medical  History:  Diagnosis Date  . Osteopenia of the elderly 12/16/2013   T score -1.3 at left femur   Current Outpatient Prescriptions on File Prior to Visit  Medication Sig Dispense Refill  . atorvastatin (LIPITOR) 40 MG tablet TAKE 1 TABLET EVERY DAY.  "OV NEEDED FOR ADDITIONAL REFILLS" (Patient not taking: Reported on 05/22/2016) 30 tablet 0  . ergocalciferol (VITAMIN D2) 50000 UNITS capsule Take 1 capsule (50,000 Units total) by mouth once a week. (Patient not taking: Reported on 05/22/2016) 12 capsule 1  . OVER THE COUNTER MEDICATION Reported on 11/29/2015    . Zoster Vaccine Live, PF, (ZOSTAVAX) 40981 UNT/0.65ML injection Inject 19,400 Units into the skin once. (Patient not taking: Reported on 05/22/2016) 1 vial 0   No current facility-administered medications on file prior to visit.    No Known Allergies   No past surgical history on file. No family history on file. Social History   Social History  . Marital status: Single    Spouse name: N/A  . Number of children: N/A  . Years of education: N/A   Social History Main Topics  . Smoking status: Current Every Day Smoker  . Smokeless tobacco: Not on file  . Alcohol use No  . Drug use: No  . Sexual activity: Not on file   Other Topics Concern  . Not on file   Social History Narrative   Significant other. Education: [employer].   Depression screen  [facility] 2/9 11/26/2016 05/22/2016 11/29/2015 06/01/2015 12/09/2013  Decreased Interest 0 0 0 0 0  Down, Depressed, Hopeless 0 0 0 0 0  PHQ - 2 Score 0 0 0 0 0     Review of Systems  Constitutional: Positive for fatigue. Negative for diaphoresis and unexpected weight change.  Eyes: Negative for visual disturbance.  Cardiovascular: Negative for chest pain and palpitations.  Gastrointestinal: Negative for constipation and diarrhea.  Endocrine: Positive for polydipsia.  Genitourinary: Negative for difficulty urinating.  Psychiatric/Behavioral: Positive for confusion.      Objective:     Physical Exam  Constitutional: She is oriented to person, place, and time. She appears well-developed and well-nourished. No distress.  HENT:  Head: Normocephalic and atraumatic.  Eyes: Conjunctivae and EOM are normal.  Neck: Neck supple. No tracheal deviation present.  Cardiovascular: Normal rate.   Pulmonary/Chest: Effort normal. No respiratory distress. She has rales (bibasilar).  Decreased air movement throughout  Musculoskeletal: Normal range of motion.  Neurological: She is alert and oriented to person, place, and time.  Skin: Skin is warm and dry.  Psychiatric: She has a normal mood and affect. Her behavior is normal.  Nursing note and vitals reviewed.   BP (!) 155/70 (BP Location: Right Arm, Patient Position: Sitting, Cuff Size: Small)   Pulse 75   Temp 97.7 F (36.5 C) (Oral)   Resp 16   Ht 5' 0.5" (1.537 m)   Wt 130 lb (59 kg)   SpO2 99%   BMI 24.97 kg/m    PF: 320. Predicted PF: 409  EKG reading done by Delman Cheadle, MD: Normal sinus rhythm. First degree A-V block. PR 232 with atrial enlargement.   Results for orders placed or performed in visit on 11/26/16  POCT urinalysis dipstick  Result Value Ref Range   Color, UA yellow yellow   Clarity, UA clear clear   Glucose, UA negative negative mg/dL   Bilirubin, UA negative negative   Ketones, POC UA negative negative mg/dL   Spec Grav, UA <=1.005 (A) 1.010 - 1.025   Blood, UA negative negative   pH, UA 6.0 5.0 - 8.0   Protein Ur, POC negative negative mg/dL   Urobilinogen, UA 0.2 0.2 or 1.0 E.U./dL   Nitrite, UA Negative Negative   Leukocytes, UA Trace (A) Negative  POCT CBC  Result Value Ref Range   WBC 8.0 4.6 - 10.2 K/uL   Lymph, poc 3.7 (A) 0.6 - 3.4   POC LYMPH PERCENT 46.4 10 - 50 %L   MID (cbc) 0.3 0 - 0.9   POC MID % 3.6 0 - 12 %M   POC Granulocyte 4.0 2 - 6.9   Granulocyte percent 50.0 37 - 80 %G   RBC 4.22 4.04 - 5.48 M/uL   Hemoglobin 11.6 (A) 12.2 - 16.2 g/dL   HCT, POC 34.6 (A) 37.7 - 47.9 %    MCV 82.1 80 - 97 fL   MCH, POC 27.5 27 - 31.2 pg   MCHC 33.6 31.8 - 35.4 g/dL   RDW, POC 15.3 %   Platelet Count, POC 305 142 - 424 K/uL   MPV 7.0 0 - 99.8 fL  POCT glycosylated hemoglobin (Hb A1C)  Result Value Ref Range   Hemoglobin A1C 6.6    Dg Chest 2 View  Result Date: 11/26/2016 CLINICAL DATA:  Fatigue.  Decreased exercise tolerance. EXAM: CHEST  2 VIEW COMPARISON:  08/25/2006. FINDINGS: Mediastinum hilar structures normal. Lungs are clear. No focal infiltrate. No pleural effusion  or pneumothorax. Degenerative changes thoracic spine. Diffuse thoracic spine osteopenia. Stable mild mid thoracic vertebral body compression fracture. IMPRESSION: No acute cardiopulmonary disease. Electronically Signed   By: Marcello Moores  Register   On: 11/26/2016 16:42   CBC Latest Ref Rng & Units 11/26/2016 05/22/2016 05/27/2013  WBC 4.6 - 10.2 K/uL 8.0 7.2 10.0  Hemoglobin 12.2 - 16.2 g/dL 11.6(A) 11.5(L) 11.5(L)  Hematocrit 37.7 - 47.9 % 34.6(A) 35.2 33.5(L)  Platelets 140 - 400 K/uL - 286 294   Assessment & Plan:  ASCVD risk is 23.1% even without DM. New diagnosis of DM increases it to 46.7 %.  1. Fatigue, unspecified type   2. Confusion   3. Pure hypercholesterolemia   4. Vitamin D deficiency   5. Tobacco abuse   6. Elevated blood pressure reading   7. Type 2 diabetes mellitus without complication, without long-term current use of insulin ([place])     Orders Placed This Encounter  Procedures  . DG Chest 2 View    Standing Status:   Future    Number of Occurrences:   1    Standing Expiration Date:   11/26/2017    Order Specific Question:   Reason for Exam (SYMPTOM  OR DIAGNOSIS REQUIRED)    Answer:   fatigue, decreased exercise tolerance, ongoing heavy tobacco use, bibasilar rales.    Order Specific Question:   Preferred imaging location?    Answer:   External  . Comprehensive metabolic panel  . TSH  . Microalbumin/Creatinine Ratio, Urine  . Care order/instruction:    Scheduling  Instructions:     Recheck BP  . Care order/instruction:    Scheduling Instructions:     [place] (IF NEB IS ORDERED PLEASE DO BEFORE AND AFTER NEB)  . POCT urinalysis dipstick  . POCT Microscopic Urinalysis ([facility])  . POCT CBC  . POCT glycosylated hemoglobin (Hb A1C)  . EKG 12-Lead    Meds ordered this encounter  Medications  . albuterol (PROVENTIL) (2.5 MG/3ML) 0.083% nebulizer solution 2.5 mg  . ipratropium (ATROVENT) nebulizer solution 0.5 mg  . ergocalciferol (VITAMIN D2) 50000 units capsule    Sig: Take 1 capsule (50,000 Units total) by mouth once a week.    Dispense:  12 capsule    Refill:  1  . atorvastatin (LIPITOR) 40 MG tablet    Sig: TAKE 1 TABLET EVERY DAY    Dispense:  90 tablet    Refill:  3  . lisinopril (PRINIVIL,ZESTRIL) 5 MG tablet    Sig: Take 1 tablet (5 mg total) by mouth daily.    Dispense:  30 tablet    Refill:  3  . blood glucose meter kit and supplies KIT    Sig: Dispense based on patient and insurance preference. Use up to four times daily as directed. (FOR ICD-9 250.00, 250.01).    Dispense:  1 each    Refill:  0    Order Specific Question:   Number of strips    Answer:   1000    Order Specific Question:   Number of lancets    Answer:   1000  . albuterol (PROVENTIL HFA;VENTOLIN HFA) 108 (90 Base) MCG/ACT inhaler    Sig: Inhale 2 puffs into the lungs every 4 (four) hours as needed for wheezing or shortness of breath (cough, shortness of breath or wheezing.).    Dispense:  1 Inhaler    Refill:  1    I personally performed the services described in this documentation, which was scribed in  my presence. The recorded information has been reviewed and considered, and addended by me as needed.   Delman Cheadle, M.D.  Primary Care at [facility] [address] [place], [place] [address] [phone] phone [phone] fax  12/09/16 3:04 AM

## 2016-11-26 NOTE — Patient Instructions (Addendum)
If the prescription once a week vitamin D is expensive, you can start over-the-counter vitamin D 5000u instead.     IF you received an x-ray today, you will receive an invoice from [facility]. Please contact [facility] at [phone] with questions or concerns regarding your invoice.   IF you received labwork today, you will receive an invoice from [facility]. Please contact [facility] at [phone] with questions or concerns regarding your invoice.   Our billing staff will not be able to assist you with questions regarding bills from these companies.  You will be contacted with the lab results as soon as they are available. The fastest way to get your results is to activate your My Chart account. Instructions are located on the last page of this paperwork. If you have not heard from us regarding the results in 2 weeks, please contact this office.     Fatigue Fatigue is feeling tired all of the time, a lack of energy, or a lack of motivation. Occasional or mild fatigue is often a normal response to activity or life in general. However, long-lasting (chronic) or extreme fatigue may indicate an underlying medical condition. Follow these instructions at home: Watch your fatigue for any changes. The following actions may help to lessen any discomfort you are feeling:  Talk to your health care provider about how much sleep you need each night. Try to get the required amount every night.  Take medicines only as directed by your health care provider.  Eat a healthy and nutritious diet. Ask your health care provider if you need help changing your diet.  Drink enough fluid to keep your urine clear or pale yellow.  Practice ways of relaxing, such as yoga, meditation, massage therapy, or acupuncture.  Exercise regularly.  Change situations that cause you stress. Try to keep your work and personal routine reasonable.  Do not abuse illegal drugs.  Limit alcohol intake to no  more than 1 drink per day for nonpregnant women and 2 drinks per day for men. One drink equals 12 ounces of beer, 5 ounces of wine, or 1 ounces of hard liquor.  Take a multivitamin, if directed by your health care provider. Contact a health care provider if:  Your fatigue does not get better.  You have a fever.  You have unintentional weight loss or gain.  You have headaches.  You have difficulty:  Falling asleep.  Sleeping throughout the night.  You feel angry, guilty, anxious, or sad.  You are unable to have a bowel movement (constipation).  You skin is dry.  Your legs or another part of your body is swollen. Get help right away if:  You feel confused.  Your vision is blurry.  You feel faint or pass out.  You have a severe headache.  You have severe abdominal, pelvic, or back pain.  You have chest pain, shortness of breath, or an irregular or fast heartbeat.  You are unable to urinate or you urinate less than normal.  You develop abnormal bleeding, such as bleeding from the rectum, vagina, nose, lungs, or nipples.  You vomit blood.  You have thoughts about harming yourself or committing suicide.  You are worried that you might harm someone else. This information is not intended to replace advice given to you by your health care provider. Make sure you discuss any questions you have with your health care provider. Document Released: 04/20/2007 Document Revised: 11/29/2015 Document Reviewed: 10/25/2013 Elsevier Interactive Patient Education  2017 [employer]

## 2016-11-27 LAB — COMPREHENSIVE METABOLIC PANEL
ALBUMIN: 4.7 g/dL (ref 3.6–4.8)
ALT: 10 IU/L (ref 0–32)
AST: 20 IU/L (ref 0–40)
Albumin/Globulin Ratio: 1.5 (ref 1.2–2.2)
Alkaline Phosphatase: 78 IU/L (ref 39–117)
BUN / CREAT RATIO: 15 (ref 12–28)
BUN: 14 mg/dL (ref 8–27)
CALCIUM: 9.8 mg/dL (ref 8.7–10.3)
CO2: 17 mmol/L — AB (ref 18–29)
CREATININE: 0.95 mg/dL (ref 0.57–1.00)
Chloride: 105 mmol/L (ref 96–106)
GFR calc Af Amer: 71 mL/min/{1.73_m2} (ref >59)
GFR, EST NON AFRICAN AMERICAN: 62 mL/min/{1.73_m2} (ref >59)
GLUCOSE: 88 mg/dL (ref 65–99)
Globulin, Total: 3.1 g/dL (ref 1.5–4.5)
Potassium: 3.8 mmol/L (ref 3.5–5.2)
Sodium: 139 mmol/L (ref 134–144)
TOTAL PROTEIN: 7.8 g/dL (ref 6.0–8.5)

## 2016-11-27 LAB — MICROALBUMIN / CREATININE URINE RATIO
CREATININE, UR: 15.9 mg/dL
Microalb/Creat Ratio: <18.9 mg/g creat (ref 0.0–30.0)

## 2016-11-27 LAB — TSH: TSH: 1.19 u[IU]/mL (ref 0.450–4.500)

## 2016-12-10 ENCOUNTER — Encounter: Payer: Self-pay | Admitting: Family Medicine

## 2016-12-10 ENCOUNTER — Ambulatory Visit (INDEPENDENT_AMBULATORY_CARE_PROVIDER_SITE_OTHER): Payer: Medicare HMO | Admitting: Family Medicine

## 2016-12-10 VITALS — BP 122/72 | HR 78 | Temp 98.2°F | Resp 17 | Ht 61.0 in | Wt 129.0 lb

## 2016-12-10 DIAGNOSIS — E119 Type 2 diabetes mellitus without complications: Secondary | ICD-10-CM | POA: Diagnosis not present

## 2016-12-10 DIAGNOSIS — E7800 Pure hypercholesterolemia, unspecified: Secondary | ICD-10-CM

## 2016-12-10 NOTE — Patient Instructions (Signed)
     IF you received an x-ray today, you will receive an invoice from [facility]. Please contact [facility] at [phone] with questions or concerns regarding your invoice.   IF you received labwork today, you will receive an invoice from [facility]. Please contact [facility] at [phone] with questions or concerns regarding your invoice.   Our billing staff will not be able to assist you with questions regarding bills from these companies.  You will be contacted with the lab results as soon as they are available. The fastest way to get your results is to activate your My Chart account. Instructions are located on the last page of this paperwork. If you have not heard from us regarding the results in 2 weeks, please contact this office.     

## 2016-12-10 NOTE — Progress Notes (Addendum)
Subjective:  By signing my name below, I, Essence Howell, attest that this documentation has been prepared under the direction and in the presence of Delman Cheadle, MD Electronically Signed: Ladene Artist, ED Scribe 12/10/2016 at 4:21 PM.   Patient ID: Sharon Peterson, female    DOB: May 04, 1951, 66 y.o.   MRN: [id number]  Chief Complaint  Patient presents with  . Other    med check    HPI Sharon Peterson is a 66 y.o. female who presents to Primary Care at [facility] for follow-up of new diagnosis of diabetes made 2 weeks prior. Pt was asked to keep a log of her CBGs and return for review for which she is here today. Pt states that her mood is getting better, she has energy and her memory is returning. She also reports that dry mouth has resolved. Fasting AM CBGs: 100-120, majority around 105, lowest 84. 2 hours after lunch: 94-140, majority in 110s. Before lunch: 90-110s. Pt has started Vitamin D once a week without any side effects. She only eats two meals a day, breakfast which consists of toast and coffee, and a larger meal later on in the day. However, she reports finishing a 24 pack of water within 2-3 days. Pt states that she has a few pieces of hard candies/mints a day or a slice of cake. She also states that she eats doughnuts or cupcakes once a week. Pt denies side effects from Lipitor.   Past Medical History:  Diagnosis Date  . Osteopenia of the elderly 12/16/2013   T score -1.3 at left femur   Current Outpatient Prescriptions on File Prior to Visit  Medication Sig Dispense Refill  . albuterol (PROVENTIL HFA;VENTOLIN HFA) 108 (90 Base) MCG/ACT inhaler Inhale 2 puffs into the lungs every 4 (four) hours as needed for wheezing or shortness of breath (cough, shortness of breath or wheezing.). 1 Inhaler 1  . atorvastatin (LIPITOR) 40 MG tablet TAKE 1 TABLET EVERY DAY 90 tablet 3  . blood glucose meter kit and supplies KIT Dispense based on patient and insurance preference. Use up to four  times daily as directed. (FOR ICD-9 250.00, 250.01). 1 each 0  . ergocalciferol (VITAMIN D2) 50000 units capsule Take 1 capsule (50,000 Units total) by mouth once a week. 12 capsule 1  . lisinopril (PRINIVIL,ZESTRIL) 5 MG tablet Take 1 tablet (5 mg total) by mouth daily. 30 tablet 3  . OVER THE COUNTER MEDICATION Reported on 11/29/2015     No current facility-administered medications on file prior to visit.    No Known Allergies   No past surgical history on file. No family history on file. Social History   Social History  . Marital status: Single    Spouse name: N/A  . Number of children: N/A  . Years of education: N/A   Social History Main Topics  . Smoking status: Current Every Day Smoker    Packs/day: 0.60    Years: 56.00  . Smokeless tobacco: Never Used  . Alcohol use No  . Drug use: No  . Sexual activity: Not Asked   Other Topics Concern  . None   Social History Narrative   Significant other. Education: [employer].   Depression screen [facility] 2/9 11/26/2016 05/22/2016 11/29/2015 06/01/2015 12/09/2013  Decreased Interest 0 0 0 0 0  Down, Depressed, Hopeless 0 0 0 0 0  PHQ - 2 Score 0 0 0 0 0     Review of Systems  Constitutional: Positive for activity  change (increased as energy increased). Negative for appetite change (low) and fatigue.  HENT:       Dry mouth  Respiratory: Negative for cough, chest tightness and shortness of breath.   Cardiovascular: Negative for chest pain, palpitations and leg swelling.  Endocrine: Positive for polydipsia. Negative for polyphagia and polyuria.  Psychiatric/Behavioral: Negative for agitation, behavioral problems, confusion, decreased concentration, dysphoric mood and sleep disturbance. The patient is not nervous/anxious.       Objective:   Physical Exam  Constitutional: She is oriented to person, place, and time. She appears well-developed and well-nourished. No distress.  HENT:  Head: Normocephalic and atraumatic.  Eyes:  Conjunctivae and EOM are normal.  Neck: Neck supple. No tracheal deviation present.  Cardiovascular: Normal rate, regular rhythm, S1 normal, S2 normal and normal heart sounds.   Pulmonary/Chest: Effort normal and breath sounds normal. No respiratory distress.  Musculoskeletal: Normal range of motion.  Neurological: She is alert and oriented to person, place, and time.  Skin: Skin is warm and dry.  Psychiatric: She has a normal mood and affect. Her behavior is normal.  Nursing note and vitals reviewed.  BP 122/72   Pulse 78   Temp 98.2 F (36.8 C) (Oral)   Resp 17   Ht '5\' 1"'  (1.549 m)   Wt 129 lb (58.5 kg)   SpO2 98%   BMI 24.37 kg/m     Assessment & Plan:  Last lipid panel Nov. Pt had been out of Lipitor for a while at that time. Has resumed since. Will plan to recheck fasting labs at next visit in 3 months as is not fasting today. Reviewed diabetic diet changes. encouraged decreased sugar and more frequent snacks. Recheck in 3 months for A1C. No need to continue to monitor CBGs.   1. Type 2 diabetes mellitus without complication, without long-term current use of insulin ([place])   2. Pure hypercholesterolemia      I personally performed the services described in this documentation, which was scribed in my presence. The recorded information has been reviewed and considered, and addended by me as needed.   Delman Cheadle, M.D.  Primary Care at [facility] [address] [place], [place] [address] [phone] phone [phone] fax  12/11/16 12:55 AM

## 2017-02-02 ENCOUNTER — Other Ambulatory Visit: Payer: Self-pay | Admitting: Family Medicine

## 2017-02-02 DIAGNOSIS — Z1231 Encounter for screening mammogram for malignant neoplasm of breast: Secondary | ICD-10-CM

## 2017-03-04 ENCOUNTER — Other Ambulatory Visit: Payer: Self-pay | Admitting: Family Medicine

## 2017-03-05 ENCOUNTER — Ambulatory Visit
Admission: RE | Admit: 2017-03-05 | Discharge: 2017-03-05 | Disposition: A | Discharge status: Home / Self Care | Payer: Commercial Managed Care - HMO | Source: Ambulatory Visit | Attending: Family Medicine | Admitting: Family Medicine

## 2017-03-05 DIAGNOSIS — Z1231 Encounter for screening mammogram for malignant neoplasm of breast: Secondary | ICD-10-CM

## 2017-03-05 NOTE — Telephone Encounter (Signed)
Pt seen June, 2018 To return in 3 mo for fasting labs, eval Refilled Lisinopril x 30 days Sent to scheduling to call pt for appt in sept

## 2017-05-09 ENCOUNTER — Ambulatory Visit: Payer: Medicare HMO | Admitting: Family Medicine

## 2017-05-14 ENCOUNTER — Encounter: Payer: Self-pay | Admitting: Family Medicine

## 2017-05-14 ENCOUNTER — Ambulatory Visit (INDEPENDENT_AMBULATORY_CARE_PROVIDER_SITE_OTHER): Payer: Medicare HMO

## 2017-05-14 ENCOUNTER — Other Ambulatory Visit: Payer: Self-pay

## 2017-05-14 ENCOUNTER — Ambulatory Visit: Payer: Medicare HMO | Admitting: Family Medicine

## 2017-05-14 VITALS — BP 132/62 | HR 78 | Temp 98.7°F | Resp 16 | Ht 61.0 in | Wt 133.2 lb

## 2017-05-14 DIAGNOSIS — E559 Vitamin D deficiency, unspecified: Secondary | ICD-10-CM | POA: Diagnosis not present

## 2017-05-14 DIAGNOSIS — I739 Peripheral vascular disease, unspecified: Secondary | ICD-10-CM

## 2017-05-14 DIAGNOSIS — M8580 Other specified disorders of bone density and structure, unspecified site: Secondary | ICD-10-CM

## 2017-05-14 DIAGNOSIS — Z23 Encounter for immunization: Secondary | ICD-10-CM | POA: Diagnosis not present

## 2017-05-14 DIAGNOSIS — M79605 Pain in left leg: Secondary | ICD-10-CM

## 2017-05-14 DIAGNOSIS — E119 Type 2 diabetes mellitus without complications: Secondary | ICD-10-CM

## 2017-05-14 DIAGNOSIS — F17200 Nicotine dependence, unspecified, uncomplicated: Secondary | ICD-10-CM | POA: Diagnosis not present

## 2017-05-14 DIAGNOSIS — E785 Hyperlipidemia, unspecified: Secondary | ICD-10-CM | POA: Diagnosis not present

## 2017-05-14 DIAGNOSIS — M47816 Spondylosis without myelopathy or radiculopathy, lumbar region: Secondary | ICD-10-CM | POA: Diagnosis not present

## 2017-05-14 LAB — POCT URINALYSIS DIP (MANUAL ENTRY)
BILIRUBIN UA: NEGATIVE
Blood, UA: NEGATIVE
GLUCOSE UA: NEGATIVE mg/dL
Ketones, POC UA: NEGATIVE mg/dL
NITRITE UA: NEGATIVE
Protein Ur, POC: NEGATIVE mg/dL
Spec Grav, UA: 1.01 (ref 1.010–1.025)
Urobilinogen, UA: 0.2 E.U./dL
pH, UA: 7 (ref 5.0–8.0)

## 2017-05-14 LAB — POCT GLYCOSYLATED HEMOGLOBIN (HGB A1C): Hemoglobin A1C: 6.4

## 2017-05-14 MED ORDER — ASPIRIN EC 81 MG PO TBEC: 81.0000 mg | DELAYED_RELEASE_TABLET | Freq: Every day | ORAL | Status: DC

## 2017-05-14 MED ORDER — ASPIRIN EC 325 MG PO TBEC: 325.0000 mg | DELAYED_RELEASE_TABLET | Freq: Every day | ORAL | 3 refills | Status: DC

## 2017-05-14 NOTE — Progress Notes (Signed)
Subjective:    Patient ID: Sharon Peterson, female    DOB: [birth year], 66 y.o.   MRN: [id number] Chief Complaint  Patient presents with  . Hip Pain    left side for about a month or so     HPI   Decreased to 10 cigs/d. Review of Systems     Objective:   Physical Exam   Decreased muscle mass in left calf, + crmpaing in left calf. 2+ DP but PT not palp. No edema.    BP 132/62   Pulse 78   Temp 98.7 F (37.1 C)   Resp 16   Ht 5\' 1"  (1.549 m)   Wt 133 lb 3.2 oz (60.4 kg)   SpO2 99%   BMI 25.17 kg/m   Results for orders placed or performed in visit on 05/14/17  POCT glycosylated hemoglobin (Hb A1C)  Result Value Ref Range   Hemoglobin A1C 6.4   POCT urinalysis dipstick  Result Value Ref Range   Color, UA yellow yellow   Clarity, UA clear clear   Glucose, UA negative negative mg/dL   Bilirubin, UA negative negative   Ketones, POC UA negative negative mg/dL   Spec Grav, UA 5.6211.010 3.0861.010 - 1.025   Blood, UA negative negative   pH, UA 7.0 5.0 - 8.0   Protein Ur, POC negative negative mg/dL   Urobilinogen, UA 0.2 0.2 or 1.0 E.U./dL   Nitrite, UA Negative Negative   Leukocytes, UA Trace (A) Negative   Dg Lumbar Spine 2-3 Views  Result Date: 05/14/2017 CLINICAL DATA:  left lower extremity muscle pain, weakness, numbness EXAM: LUMBAR SPINE - 2-3 VIEW COMPARISON:  None. FINDINGS: Mild compression deformities of the L3 through L5 vertebral bodies, of uncertain age but favored to be chronic. Disc spaces of the lumbar spine appear relatively well preserved throughout. No osteophyte formation or other signs of advanced degenerative disc disease. No acute appearing cortical irregularity or osseous lesion. No fracture line or displaced fracture fragment. Upper sacrum appears intact and normally aligned. Atherosclerotic changes noted along the walls of the infrarenal abdominal aorta. Visualized paravertebral soft tissues are unremarkable. IMPRESSION: 1. Mild compression deformities  of the L3 through L5 vertebral bodies, of uncertain age but favored to be chronic. If any history of recent trauma or if symptoms could be radiculopathic in nature, would consider MRI of the lumbar spine for further characterization. 2. No acute appearing findings. 3. Aortic atherosclerosis. Electronically Signed   By: Bary RichardStan  Maynard M.D.   On: 05/14/2017 13:14     Assessment & Plan:   1. Need for prophylactic vaccination and inoculation against influenza   2. Type 2 diabetes mellitus without complication, without long-term current use of insulin (HCC)   3. Osteopenia, unspecified location   4. Tobacco use disorder   5. Vitamin D deficiency   6. Hyperlipidemia, unspecified hyperlipidemia type   7. Acute pain of left lower extremity   8. Claudication of left lower extremity (HCC)     Orders Placed This Encounter  Procedures  . DG Lumbar Spine 2-3 Views    Standing Status:   Future    Number of Occurrences:   1    Standing Expiration Date:   05/14/2018    Order Specific Question:   Reason for Exam (SYMPTOM  OR DIAGNOSIS REQUIRED)    Answer:   left lower extremity muscle pain, weakness, numbness    Order Specific Question:   Preferred imaging location?    Answer:   External  .  Flu Vaccine QUAD 6+ mos PF IM (Fluarix Quad PF)  . Comprehensive metabolic panel  . VITAMIN D 25 Hydroxy (Vit-D Deficiency, Fractures)  . POCT glycosylated hemoglobin (Hb A1C)  . POCT urinalysis dipstick    Meds ordered this encounter  Medications  . DISCONTD: aspirin EC 81 MG tablet    Sig: Take 1 tablet (81 mg total) daily by mouth.  Marland Kitchen. aspirin EC 325 MG tablet    Sig: Take 1 tablet (325 mg total) daily by mouth.    Dispense:  90 tablet    Refill:  3     Norberto SorensonEva Shaw, M.D.  Primary Care at [facility]  [place] [address] [place], [place] [address] [phone] phone [phone] fax  05/16/17 11:45 PM

## 2017-05-14 NOTE — Patient Instructions (Addendum)
Start an aspirin 325mg  daily every day  First, then come back and see me as soon as you get the blood pressure in your legs checked so we can check and see how it is worked, if we have found the cause or not, and what we need to do about it.    IF you received an x-ray today, you will receive an invoice from [facility]. Please contact [facility] at [phone] with questions or concerns regarding your invoice.   IF you received labwork today, you will receive an invoice from [facility]. Please contact [facility] at [phone] with questions or concerns regarding your invoice.   Our billing staff will not be able to assist you with questions regarding bills from these companies.  You will be contacted with the lab results as soon as they are available. The fastest way to get your results is to activate your My Chart account. Instructions are located on the last page of this paperwork. If you have not heard from [place] regarding the results in 2 weeks, please contact this office.      Intermittent Claudication Intermittent claudication is pain in your leg that occurs when you walk or exercise and goes away when you rest. The pain can occur in one or both legs. What are the causes? Intermittent claudication is caused by the buildup of plaque within the major arteries in the body (atherosclerosis). The plaque, which makes arteries stiff and narrow, prevents enough blood from reaching your leg muscles. The pain occurs when you walk or exercise because your muscles need more blood when you are moving and exercising. What increases the risk? Risk factors include:  A family history of atherosclerosis.  A personal history of stroke or heart disease.  Older age.  Being inactive or overweight.  Smoking cigarettes.  Having another health condition such as: ? Diabetes. ? High blood pressure. ? High cholesterol.  What are the signs or symptoms? Your hip or leg  may:  Ache.  Cramp.  Feel tight.  Feel weak.  Feel heavy.  Over time, you may feel pain in your calf, thigh, or hip. How is this diagnosed? Your health care provider may diagnose intermittent claudication based on your symptoms and medical history. Your health care provider may also do tests to learn more about your condition. These may include:  Blood tests.  An ultrasound.  Imaging tests such as angiography, magnetic resonance angiography (MRA), and computed tomography angiography (CTA).  How is this treated? You may be treated for problems such as:  High blood pressure.  High cholesterol.  Diabetes.  Other treatments may include:  Lifestyle changes such as: ? Starting an exercise program. ? Losing weight. ? Quitting smoking.  Medicines to help restore blood flow through your legs.  Blood vessel surgery (angioplasty) to restore blood flow if your intermittent claudication is caused by severe peripheral artery disease.  Follow these instructions at home:  Manage any other health conditions you have.  Eat a diet low in saturated fats and calories to maintain a healthy weight.  Quit smoking, if you smoke.  Take medicines only as directed by your health care provider.  If your health care provider recommended an exercise program for you, follow it as directed. Your exercise program may involve: ? Walking three or more times a week. ? Walking until you have certain symptoms of intermittent claudication. ? Resting until symptoms go away. ? Gradually increasing walking time to about 50 minutes a day. Contact a health  care provider if: Your condition is not getting better or is getting worse. Get help right away if:  You have chest pain.  You have difficulty breathing.  You develop arm weakness.  You have trouble speaking.  Your face begins to droop. This information is not intended to replace advice given to you by your health care provider. Make sure  you discuss any questions you have with your health care provider. Document Released: 04/25/2004 Document Revised: 11/29/2015 Document Reviewed: 09/29/2013 Elsevier Interactive Patient Education  2017 [employer] Inc[employer]  Peripheral Vascular Disease Peripheral vascular disease (PVD) is a disease of the blood vessels that are not part of your heart and brain. A simple term for PVD is poor circulation. In most cases, PVD narrows the blood vessels that carry blood from your heart to the rest of your body. This can result in a decreased supply of blood to your arms, legs, and internal organs, like your stomach or kidneys. However, it most often affects a person's lower legs and feet. There are two types of PVD.  Organic PVD. This is the more common type. It is caused by damage to the structure of blood vessels.  Functional PVD. This is caused by conditions that make blood vessels contract and tighten (spasm).  Without treatment, PVD tends to get worse over time. PVD can also lead to acute ischemic limb. This is when an arm or limb suddenly has trouble getting enough blood. This is a medical emergency. What are the causes? Each type of PVD has many different causes. The most common cause of PVD is buildup of a fatty material (plaque) inside of your arteries (atherosclerosis). Small amounts of plaque can break off from the walls of the blood vessels and become lodged in a smaller artery. This blocks blood flow and can cause acute ischemic limb. Other common causes of PVD include:  Blood clots that form inside of blood vessels.  Injuries to blood vessels.  Diseases that cause inflammation of blood vessels or cause blood vessel spasms.  Health behaviors and health history that increase your risk of developing PVD.  What increases the risk? You may have a greater risk of PVD if you:  Have a family history of PVD.  Have certain medical conditions, including: ? High cholesterol. ? Diabetes. ? High  blood pressure (hypertension). ? Coronary heart disease. ? Past problems with blood clots. ? Past injury, such as burns or a broken bone. These may have damaged blood vessels in your limbs. ? Buerger disease. This is caused by inflamed blood vessels in your hands and feet. ? Some forms of arthritis. ? Rare birth defects that affect the arteries in your legs.  Use tobacco.  Do not get enough exercise.  Are obese.  Are age 68 or older.  What are the signs or symptoms? PVD may cause many different symptoms. Your symptoms depend on what part of your body is not getting enough blood. Some common signs and symptoms include:  Cramps in your lower legs. This may be a symptom of poor leg circulation (claudication).  Pain and weakness in your legs while you are physically active that goes away when you rest (intermittent claudication).  Leg pain when at rest.  Leg numbness, tingling, or weakness.  Coldness in a leg or foot, especially when compared with the other leg.  Skin or hair changes. These can include: ? Hair loss. ? Shiny skin. ? Pale or bluish skin. ? Thick toenails.  Inability to get or maintain  an erection (erectile dysfunction).  People with PVD are more prone to developing ulcers and sores on their toes, feet, or legs. These may take longer than normal to heal. How is this diagnosed? Your health care provider may diagnose PVD from your signs and symptoms. The health care provider will also do a physical exam. You may have tests to find out what is causing your PVD and determine its severity. Tests may include:  Blood pressure recordings from your arms and legs and measurements of the strength of your pulses (pulse volume recordings).  Imaging studies using sound waves to take pictures of the blood flow through your blood vessels (Doppler ultrasound).  Injecting a dye into your blood vessels before having imaging studies using: ? X-rays (angiogram or  arteriogram). ? Computer-generated X-rays (CT angiogram). ? A powerful electromagnetic field and a computer (magnetic resonance angiogram or MRA).  How is this treated? Treatment for PVD depends on the cause of your condition and the severity of your symptoms. It also depends on your age. Underlying causes need to be treated and controlled. These include long-lasting (chronic) conditions, such as diabetes, high cholesterol, and high blood pressure. You may need to first try making lifestyle changes and taking medicines. Surgery may be needed if these do not work. Lifestyle changes may include:  Quitting smoking.  Exercising regularly.  Following a low-fat, low-cholesterol diet.  Medicines may include:  Blood thinners to prevent blood clots.  Medicines to improve blood flow.  Medicines to improve your blood cholesterol levels.  Surgical procedures may include:  A procedure that uses an inflated balloon to open a blocked artery and improve blood flow (angioplasty).  A procedure to put in a tube (stent) to keep a blocked artery open (stent implant).  Surgery to reroute blood flow around a blocked artery (peripheral bypass surgery).  Surgery to remove dead tissue from an infected wound on the affected limb.  Amputation. This is surgical removal of the affected limb. This may be necessary in cases of acute ischemic limb that are not improved through medical or surgical treatments.  Follow these instructions at home:  Take medicines only as directed by your health care provider.  Do not use any tobacco products, including cigarettes, chewing tobacco, or electronic cigarettes. If you need help quitting, ask your health care provider.  Lose weight if you are overweight, and maintain a healthy weight as directed by your health care provider.  Eat a diet that is low in fat and cholesterol. If you need help, ask your health care provider.  Exercise regularly. Ask your health care  provider to suggest some good activities for you.  Use compression stockings or other mechanical devices as directed by your health care provider.  Take good care of your feet. ? Wear comfortable shoes that fit well. ? Check your feet often for any cuts or sores. Contact a health care provider if:  You have cramps in your legs while walking.  You have leg pain when you are at rest.  You have coldness in a leg or foot.  Your skin changes.  You have erectile dysfunction.  You have cuts or sores on your feet that are not healing. Get help right away if:  Your arm or leg turns cold and blue.  Your arms or legs become red, warm, swollen, painful, or numb.  You have chest pain or trouble breathing.  You suddenly have weakness in your face, arm, or leg.  You become very confused or  lose the ability to speak.  You suddenly have a very bad headache or lose your vision. This information is not intended to replace advice given to you by your health care provider. Make sure you discuss any questions you have with your health care provider. Document Released: 07/31/2004 Document Revised: 11/29/2015 Document Reviewed: 12/01/2013 Elsevier Interactive Patient Education  2017 [employer].

## 2017-05-15 LAB — COMPREHENSIVE METABOLIC PANEL
ALK PHOS: 98 IU/L (ref 39–117)
ALT: 9 IU/L (ref 0–32)
AST: 16 IU/L (ref 0–40)
Albumin/Globulin Ratio: 1.6 (ref 1.2–2.2)
Albumin: 4.5 g/dL (ref 3.6–4.8)
BUN / CREAT RATIO: 9 — AB (ref 12–28)
BUN: 8 mg/dL (ref 8–27)
CHLORIDE: 102 mmol/L (ref 96–106)
CO2: 21 mmol/L (ref 20–29)
CREATININE: 0.88 mg/dL (ref 0.57–1.00)
Calcium: 9.9 mg/dL (ref 8.7–10.3)
GFR calc Af Amer: 78 mL/min/{1.73_m2} (ref >59)
GFR calc non Af Amer: 68 mL/min/{1.73_m2} (ref >59)
GLOBULIN, TOTAL: 2.8 g/dL (ref 1.5–4.5)
Glucose: 90 mg/dL (ref 65–99)
POTASSIUM: 4 mmol/L (ref 3.5–5.2)
SODIUM: 139 mmol/L (ref 134–144)
Total Protein: 7.3 g/dL (ref 6.0–8.5)

## 2017-05-15 LAB — VITAMIN D 25 HYDROXY (VIT D DEFICIENCY, FRACTURES): VIT D 25 HYDROXY: 15.8 ng/mL — AB (ref 30.0–100.0)

## 2017-05-26 ENCOUNTER — Ambulatory Visit (HOSPITAL_COMMUNITY): Payer: Medicare HMO

## 2017-05-30 ENCOUNTER — Other Ambulatory Visit: Payer: Self-pay | Admitting: Family Medicine

## 2017-06-08 ENCOUNTER — Other Ambulatory Visit: Payer: Self-pay | Admitting: Family Medicine

## 2017-08-12 DIAGNOSIS — H40033 Anatomical narrow angle, bilateral: Secondary | ICD-10-CM | POA: Diagnosis not present

## 2017-08-12 DIAGNOSIS — H25812 Combined forms of age-related cataract, left eye: Secondary | ICD-10-CM | POA: Diagnosis not present

## 2017-08-12 DIAGNOSIS — H25811 Combined forms of age-related cataract, right eye: Secondary | ICD-10-CM | POA: Diagnosis not present

## 2017-08-12 DIAGNOSIS — H25813 Combined forms of age-related cataract, bilateral: Secondary | ICD-10-CM | POA: Diagnosis not present

## 2017-08-12 DIAGNOSIS — Z01818 Encounter for other preprocedural examination: Secondary | ICD-10-CM | POA: Diagnosis not present

## 2017-09-10 DIAGNOSIS — H25812 Combined forms of age-related cataract, left eye: Secondary | ICD-10-CM | POA: Diagnosis not present

## 2017-09-10 DIAGNOSIS — H2512 Age-related nuclear cataract, left eye: Secondary | ICD-10-CM | POA: Diagnosis not present

## 2017-12-17 DIAGNOSIS — H25811 Combined forms of age-related cataract, right eye: Secondary | ICD-10-CM | POA: Diagnosis not present

## 2017-12-17 DIAGNOSIS — H2511 Age-related nuclear cataract, right eye: Secondary | ICD-10-CM | POA: Diagnosis not present

## 2018-01-12 ENCOUNTER — Other Ambulatory Visit: Payer: Self-pay

## 2018-01-12 ENCOUNTER — Encounter: Payer: Self-pay | Admitting: Emergency Medicine

## 2018-01-12 ENCOUNTER — Ambulatory Visit (INDEPENDENT_AMBULATORY_CARE_PROVIDER_SITE_OTHER): Payer: Medicare HMO

## 2018-01-12 ENCOUNTER — Ambulatory Visit (INDEPENDENT_AMBULATORY_CARE_PROVIDER_SITE_OTHER): Payer: Medicare HMO | Admitting: Emergency Medicine

## 2018-01-12 VITALS — BP 140/72 | HR 70 | Temp 97.6°F | Resp 16 | Ht 60.0 in | Wt 131.2 lb

## 2018-01-12 DIAGNOSIS — M7552 Bursitis of left shoulder: Secondary | ICD-10-CM

## 2018-01-12 DIAGNOSIS — M25512 Pain in left shoulder: Secondary | ICD-10-CM

## 2018-01-12 MED ORDER — DICLOFENAC SODIUM 75 MG PO TBEC: 75.0000 mg | DELAYED_RELEASE_TABLET | Freq: Two times a day (BID) | ORAL | 0 refills | Status: AC

## 2018-01-12 NOTE — Progress Notes (Signed)
Sharon Peterson 67 y.o.   Chief Complaint  Patient presents with  . Arm Pain    x 2 weeks - LEFT     HISTORY OF PRESENT ILLNESS: This is a 67 y.o. female complaining of sharp, constant, left-sided shoulder pain for 2 weeks.  Hurts to move or pick up things.  Denies injury.  Right-handed.  Denies chest pain, difficulty breathing, fever, flulike symptoms, or any other significant symptoms.  HPI   Prior to Admission medications   Medication Sig Start Date End Date Taking? Authorizing Provider  aspirin EC 325 MG tablet Take 1 tablet (325 mg total) daily by mouth. 05/14/17  Yes Sherren Mocha, MD  atorvastatin (LIPITOR) 40 MG tablet TAKE 1 TABLET EVERY DAY 11/26/16  Yes Sherren Mocha, MD    No Known Allergies  Patient Active Problem List   Diagnosis Date Noted  . Vitamin D deficiency 06/02/2015  . Type 2 diabetes mellitus without complication, without long-term current use of insulin (HCC) 06/02/2015  . Osteopenia 06/01/2015  . Hyperlipidemia 06/04/2013  . Tobacco use disorder 06/04/2013    Past Medical History:  Diagnosis Date  . Osteopenia of the elderly 12/16/2013   T score -1.3 at left femur    No past surgical history on file.  Social History   Socioeconomic History  . Marital status: Single    Spouse name: Not on file  . Number of children: Not on file  . Years of education: Not on file  . Highest education level: Not on file  Occupational History  . Not on file  Social Needs  . Financial resource strain: Not on file  . Food insecurity:    Worry: Not on file    Inability: Not on file  . Transportation needs:    Medical: Not on file    Non-medical: Not on file  Tobacco Use  . Smoking status: Current Every Day Smoker    Packs/day: 0.60    Years: 56.00    Pack years: 33.60  . Smokeless tobacco: Never Used  Substance and Sexual Activity  . Alcohol use: No  . Drug use: No  . Sexual activity: Not on file  Lifestyle  . Physical activity:    Days per week:  Not on file    Minutes per session: Not on file  . Stress: Not on file  Relationships  . Social connections:    Talks on phone: Not on file    Gets together: Not on file    Attends religious service: Not on file    Active member of club or organization: Not on file    Attends meetings of clubs or organizations: Not on file    Relationship status: Not on file  . Intimate partner violence:    Fear of current or ex partner: Not on file    Emotionally abused: Not on file    Physically abused: Not on file    Forced sexual activity: Not on file  Other Topics Concern  . Not on file  Social History Narrative   Significant other. Education: [employer].    No family history on file.   Review of Systems  Constitutional: Negative.  Negative for chills and fever.  HENT: Negative for sore throat.   Respiratory: Negative.  Negative for cough and shortness of breath.   Cardiovascular: Negative.  Negative for chest pain and palpitations.  Gastrointestinal: Negative.  Negative for abdominal pain, diarrhea, nausea and vomiting.  Musculoskeletal: Positive for joint pain (  Left shoulder).  Skin: Negative for rash.  Neurological: Negative.  Negative for dizziness, focal weakness and weakness.  Endo/Heme/Allergies: Negative.     Vitals:   01/12/18 1013  BP: 140/72  Pulse: 70  Resp: 16  Temp: 97.6 F (36.4 C)  SpO2: 98%    Physical Exam  Constitutional: She is oriented to person, place, and time. She appears well-developed and well-nourished.  HENT:  Head: Normocephalic and atraumatic.  Nose: Nose normal.  Mouth/Throat: Oropharynx is clear and moist.  Eyes: Pupils are equal, round, and reactive to light. EOM are normal.  Neck: Normal range of motion. Neck supple.  Cardiovascular: Normal rate, regular rhythm and normal heart sounds.  Pulmonary/Chest: Effort normal and breath sounds normal.  Musculoskeletal:  Left shoulder: No erythema or swelling.  No ecchymosis.  Limited range of  motion due to pain.  No clavicular tenderness.  No scapular tenderness.  Some tenderness to shoulder joint. Left upper arm: NVI.  Shoulder and wrist within normal limits.  Left hand within normal limits with good grip.  Neurological: She is alert and oriented to person, place, and time. No sensory deficit. She exhibits normal muscle tone.  Skin: Skin is warm and dry. Capillary refill takes less than 2 seconds.  Psychiatric: She has a normal mood and affect. Her behavior is normal.  Vitals reviewed.   Dg Shoulder Left  Result Date: 01/12/2018 CLINICAL DATA:  Acute left shoulder pain. EXAM: LEFT SHOULDER - 2+ VIEW COMPARISON:  None. FINDINGS: There is no evidence of fracture or dislocation. There is no evidence of arthropathy or other focal bone abnormality. Soft tissues are unremarkable. IMPRESSION: Normal radiographs. Electronically Signed   By: Paulina FusiMark  Shogry M.D.   On: 01/12/2018 10:55   A total of 25 minutes was spent in the room with the patient, greater than 50% of which was in counseling/coordination of care regarding differential diagnosis, x-ray review, treatment, medications and need for follow-up if no better or worse.  ASSESSMENT & PLAN: [name] was seen today for arm pain.  Diagnoses and all orders for this visit:  Acute pain of left shoulder -     DG Shoulder Left; Future -     diclofenac (VOLTAREN) 75 MG EC tablet; Take 1 tablet (75 mg total) by mouth 2 (two) times daily for 5 days. After 5 days take as needed.  Acute bursitis of left shoulder      Patient Instructions       IF you received an x-ray today, you will receive an invoice from [facility]. Please contact [facility] at [phone] with questions or concerns regarding your invoice.   IF you received labwork today, you will receive an invoice from [facility]. Please contact [facility] at [phone] with questions or concerns regarding your invoice.   Our billing staff will not be able to  assist you with questions regarding bills from these companies.  You will be contacted with the lab results as soon as they are available. The fastest way to get your results is to activate your My Chart account. Instructions are located on the last page of this paperwork. If you have not heard from us regarding the results in 2 weeks, please contact this office.     Shoulder Pain Many things can cause shoulder pain, including:  An injury.  Moving the arm in the same way again and again (overuse).  Joint pain (arthritis).  Follow these instructions at home: Take these actions to help with your pain:  Squeeze a  soft ball or a foam pad as much as you can. This helps to prevent swelling. It also makes the arm stronger.  Take over-the-counter and prescription medicines only as told by your doctor.  If told, put ice on the area: ? Put ice in a plastic bag. ? Place a towel between your skin and the bag. ? Leave the ice on for 20 minutes, 2-3 times per day. Stop putting on ice if it does not help with the pain.  If you were given a shoulder sling or immobilizer: ? Wear it as told. ? Remove it to shower or bathe. ? Move your arm as little as possible. ? Keep your hand moving. This helps prevent swelling.  Contact a doctor if:  Your pain gets worse.  Medicine does not help your pain.  You have new pain in your arm, hand, or fingers. Get help right away if:  Your arm, hand, or fingers: ? Tingle. ? Are numb. ? Are swollen. ? Are painful. ? Turn white or blue. This information is not intended to replace advice given to you by your health care provider. Make sure you discuss any questions you have with your health care provider. Document Released: 12/10/2007 Document Revised: 02/17/2016 Document Reviewed: 10/16/2014 Elsevier Interactive Patient Education  2018 [employer].      Edwina Barth, MD [facility]

## 2018-01-12 NOTE — Patient Instructions (Addendum)
     IF you received an x-ray today, you will receive an invoice from [facility]. Please contact [facility] at [phone] with questions or concerns regarding your invoice.   IF you received labwork today, you will receive an invoice from [facility]. Please contact [facility] at [phone] with questions or concerns regarding your invoice.   Our billing staff will not be able to assist you with questions regarding bills from these companies.  You will be contacted with the lab results as soon as they are available. The fastest way to get your results is to activate your My Chart account. Instructions are located on the last page of this paperwork. If you have not heard from us regarding the results in 2 weeks, please contact this office.     Shoulder Pain Many things can cause shoulder pain, including:  An injury.  Moving the arm in the same way again and again (overuse).  Joint pain (arthritis).  Follow these instructions at home: Take these actions to help with your pain:  Squeeze a soft ball or a foam pad as much as you can. This helps to prevent swelling. It also makes the arm stronger.  Take over-the-counter and prescription medicines only as told by your doctor.  If told, put ice on the area: ? Put ice in a plastic bag. ? Place a towel between your skin and the bag. ? Leave the ice on for 20 minutes, 2-3 times per day. Stop putting on ice if it does not help with the pain.  If you were given a shoulder sling or immobilizer: ? Wear it as told. ? Remove it to shower or bathe. ? Move your arm as little as possible. ? Keep your hand moving. This helps prevent swelling.  Contact a doctor if:  Your pain gets worse.  Medicine does not help your pain.  You have new pain in your arm, hand, or fingers. Get help right away if:  Your arm, hand, or fingers: ? Tingle. ? Are numb. ? Are swollen. ? Are painful. ? Turn white or blue. This information is  not intended to replace advice given to you by your health care provider. Make sure you discuss any questions you have with your health care provider. Document Released: 12/10/2007 Document Revised: 02/17/2016 Document Reviewed: 10/16/2014 Elsevier Interactive Patient Education  2018 Elsevier Inc.  

## 2018-01-26 ENCOUNTER — Other Ambulatory Visit: Payer: Self-pay | Admitting: Family Medicine

## 2018-01-26 DIAGNOSIS — Z1231 Encounter for screening mammogram for malignant neoplasm of breast: Secondary | ICD-10-CM

## 2018-01-26 DIAGNOSIS — R6889 Other general symptoms and signs: Secondary | ICD-10-CM | POA: Diagnosis not present

## 2018-01-28 ENCOUNTER — Encounter

## 2018-01-28 ENCOUNTER — Other Ambulatory Visit: Payer: Self-pay

## 2018-01-28 ENCOUNTER — Ambulatory Visit (INDEPENDENT_AMBULATORY_CARE_PROVIDER_SITE_OTHER): Payer: Medicare HMO | Admitting: Family Medicine

## 2018-01-28 DIAGNOSIS — M7542 Impingement syndrome of left shoulder: Secondary | ICD-10-CM

## 2018-01-28 DIAGNOSIS — M7552 Bursitis of left shoulder: Secondary | ICD-10-CM | POA: Diagnosis not present

## 2018-01-28 NOTE — Progress Notes (Signed)
Subjective:  By signing my name below, I, Stann Oresung-Kai Tsai, attest that this documentation has been prepared under the direction and in the presence of Norberto SorensonEva Shaw, MD. Electronically Signed: Stann Oresung-Kai Tsai, Scribe. 01/28/2018 , 10:53 AM .  Patient was seen in Room 2 .   Patient ID: Sharon Peterson, female    DOB: 08-20-1950, 67 y.o.   MRN: [id number] Chief Complaint  Patient presents with  . Shoulder Pain    Pt states she feels her left shoulder pain has not gotten any better, feels like it has spread towards her back, pt has tried diclofenac 75mg  but does not feel this helped.   HPI Sharon Peterson is a 67 y.o. female who [facility] complaining of left shoulder pain for a few weeks. She was seen by Dr. Alvy BimlerSagardia on 01/12/18 for left sided sharp shoulder pain and had xrays done. She was prescribed diclofenac without any relief. She states pain does go down her left elbow, but no pain into her fingers and forearm. She denies any known injuries to the area. She mentions she's been picking up her grandson. She prefers steroid injection over oral steroid because she notes she's been more irritable lately.   Past Medical History:  Diagnosis Date  . Osteopenia of the elderly 12/16/2013   T score -1.3 at left femur   No past surgical history on file. Prior to Admission medications   Medication Sig Start Date End Date Taking? Authorizing Provider  aspirin EC 325 MG tablet Take 1 tablet (325 mg total) daily by mouth. 05/14/17   Sherren MochaShaw, Eva N, MD  atorvastatin (LIPITOR) 40 MG tablet TAKE 1 TABLET EVERY DAY 11/26/16   Sherren MochaShaw, Eva N, MD   No Known Allergies No family history on file. Social History   Socioeconomic History  . Marital status: Single    Spouse name: Not on file  . Number of children: Not on file  . Years of education: Not on file  . Highest education level: Not on file  Occupational History  . Not on file  Social Needs  . Financial resource strain: Not on file   . Food insecurity:    Worry: Not on file    Inability: Not on file  . Transportation needs:    Medical: Not on file    Non-medical: Not on file  Tobacco Use  . Smoking status: Current Every Day Smoker    Packs/day: 0.60    Years: 56.00    Pack years: 33.60  . Smokeless tobacco: Never Used  Substance and Sexual Activity  . Alcohol use: No  . Drug use: No  . Sexual activity: Not on file  Lifestyle  . Physical activity:    Days per week: Not on file    Minutes per session: Not on file  . Stress: Not on file  Relationships  . Social connections:    Talks on phone: Not on file    Gets together: Not on file    Attends religious service: Not on file    Active member of club or organization: Not on file    Attends meetings of clubs or organizations: Not on file    Relationship status: Not on file  Other Topics Concern  . Not on file  Social History Narrative   Significant other. Education: [employer].   Depression screen [employer] 2/9 01/28/2018 01/12/2018 05/14/2017 11/26/2016 05/22/2016  Decreased Interest 0 0 0 0 0  Down, Depressed, Hopeless 0 0  0 0 0  PHQ - 2 Score 0 0 0 0 0    Review of Systems  Constitutional: Negative for chills, fatigue, fever and unexpected weight change.  Respiratory: Negative for cough.   Gastrointestinal: Negative for constipation, diarrhea, nausea and vomiting.  Musculoskeletal: Positive for arthralgias (left shoulder).  Skin: Negative for rash and wound.  Neurological: Negative for dizziness, weakness and headaches.       Objective:   Physical Exam  Constitutional: She is oriented to person, place, and time. She appears well-developed and well-nourished. No distress.  HENT:  Head: Normocephalic and atraumatic.  Eyes: Pupils are equal, round, and reactive to light. EOM are normal.  Neck: Neck supple.  Cardiovascular: Normal rate.  Pulmonary/Chest: Effort normal. No respiratory distress.  Musculoskeletal: Normal range of motion.  Shoulder:  abduction greater than 90 degrees is impossible, flexion to 90 degrees, mild restriction of internal and external rotation, normal adduction, point tenderness at the subacromial bursa, no focal tenderness over the [facility] joint, mild pain over the coracoid process, positive neers and hawkins, some tenderness more with resisted tricep than bicep testing, 5/5 upper extremity strength, 5/5 wrist flexion and extension  Neurological: She is alert and oriented to person, place, and time.  Skin: Skin is warm and dry.  Psychiatric: She has a normal mood and affect. Her behavior is normal.  Nursing note and vitals reviewed.   There were no vitals taken for this visit.     Assessment & Plan:   1. Subacromial bursitis of left shoulder joint   2. Impingement syndrome of left shoulder     I personally performed the services described in this documentation, which was scribed in my presence. The recorded information has been reviewed and considered, and addended by me as needed.   Norberto Sorenson, M.D.  Primary Care at [facility] [address] [place], [place] [address] [phone] phone [phone] fax  02/11/18 6:47 PM

## 2018-01-28 NOTE — Patient Instructions (Addendum)
Ice 15-20 minutes 3-4 times a day x 3-5 days until acute pain relieved.  Then start home stretching exercises. When you are tolerating those well, progress to the strengthening exercises.  If you are still having pain, come back as we may need to do a course of oral prednisone and sent you to a physical therapist. If you would like to make an appointment with our sports medicine doctor - Dr. Silas [place] - for further eval you are always welcome or I am happy to refer you to orthopedics but they almost always will send you to 6 weeks of physical therapy before they start any more serious interventions unless your symptoms are significantly worsening or you develop any numbness/weakness in the arm in which case you should be seen here or by orthopedics/sports medicine asap.    IF you received an x-ray today, you will receive an invoice from [facility]. Please contact [facility] at [phone] with questions or concerns regarding your invoice.   IF you received labwork today, you will receive an invoice from [facility]. Please contact [facility] at [phone] with questions or concerns regarding your invoice.   Our billing staff will not be able to assist you with questions regarding bills from these companies.  You will be contacted with the lab results as soon as they are available. The fastest way to get your results is to activate your My Chart account. Instructions are located on the last page of this paperwork. If you have not heard from [place] regarding the results in 2 weeks, please contact this office.     Bursitis Bursitis is inflammation and irritation of a bursa, which is one of the small, fluid-filled sacs that cushion and protect the moving parts of your body. These sacs are located between bones and muscles, muscle attachments, or skin areas next to bones. A bursa protects these structures from the wear and tear that results from frequent movement. An inflamed bursa causes  pain and swelling. Fluid may build up inside the sac. Bursitis is most common near joints, especially the knees, elbows, hips, and shoulders. What are the causes? Bursitis can be caused by:  Injury from: ? A direct blow, like falling on your knee or elbow. ? Overuse of a joint (repetitive stress).  Infection. This can happen if bacteria gets into a bursa through a cut or scrape near a joint.  Diseases that cause joint inflammation, such as gout and rheumatoid arthritis.  What increases the risk? You may be at risk for bursitis if you:  Have a job or hobby that involves a lot of repetitive stress on your joints.  Have a condition that weakens your body's defense system (immune system), such as diabetes, cancer, or HIV.  Lift and reach overhead often.  Kneel or lean on hard surfaces often.  Run or walk often.  What are the signs or symptoms? The most common signs and symptoms of bursitis are:  Pain that gets worse when you move the affected body part or put weight on it.  Inflammation.  Stiffness.  Other signs and symptoms may include:  Redness.  Tenderness.  Warmth.  Pain that continues after rest.  Fever and chills. This may occur in bursitis caused by infection.  How is this diagnosed? Bursitis may be diagnosed by:  Medical history and physical exam.  MRI.  A procedure to drain fluid from the bursa with a needle (aspiration). The fluid may be checked for signs of infection or gout.  Blood  tests to rule out other causes of inflammation.  How is this treated? Bursitis can usually be treated at home with rest, ice, compression, and elevation (RICE). For mild bursitis, RICE treatment may be all you need. Other treatments may include:  Nonsteroidal anti-inflammatory drugs (NSAIDs) to treat pain and inflammation.  Corticosteroids to fight inflammation. You may have these drugs injected into and around the area of bursitis.  Aspiration of bursitis fluid to  relieve pain and improve movement.  Antibiotic medicine to treat an infected bursa.  A splint, brace, or walking aid.  Physical therapy if you continue to have pain or limited movement.  Surgery to remove a damaged or infected bursa. This may be needed if you have a very bad case of bursitis or if other treatments have not worked.  Follow these instructions at home:  Take medicines only as directed by your health care provider.  If you were prescribed an antibiotic medicine, finish it all even if you start to feel better.  Rest the affected area as directed by your health care provider. ? Keep the area elevated. ? Avoid activities that make pain worse.  Apply ice to the injured area: ? Place ice in a plastic bag. ? Place a towel between your skin and the bag. ? Leave the ice on for 20 minutes, 2-3 times a day.  Use splints, braces, pads, or walking aids as directed by your health care provider.  Keep all follow-up visits as directed by your health care provider. This is important. How is this prevented?  Wear knee pads if you kneel often.  Wear sturdy running or walking shoes that fit you well.  Take regular breaks from repetitive activity.  Warm up by stretching before doing any strenuous activity.  Maintain a healthy weight or lose weight as recommended by your health care provider. Ask your health care provider if you need help.  Exercise regularly. Start any new physical activity gradually. Contact a health care provider if:  Your bursitis is not responding to treatment or home care.  You have a fever.  You have chills. This information is not intended to replace advice given to you by your health care provider. Make sure you discuss any questions you have with your health care provider. Document Released: 06/20/2000 Document Revised: 11/29/2015 Document Reviewed: 09/12/2013 Elsevier Interactive Patient Education  2018 Elsevier Inc.  Shoulder Impingement  Syndrome Shoulder impingement syndrome is a condition that causes pain when connective tissues (tendons) surrounding the shoulder joint become pinched. These tendons are part of the group of muscles and tissues that help to stabilize the shoulder (rotator cuff). Beneath the rotator cuff is a fluid-filled sac (bursa) that allows the muscles and tendons to glide smoothly. The bursa may become swollen or irritated (bursitis). Bursitis, swelling in the rotator cuff tendons, or both conditions can decrease how much space is under a bone in the shoulder joint (acromion), resulting in impingement. What are the causes? Shoulder impingement syndrome can be caused by bursitis or swelling of the rotator cuff tendons, which may result from:  Repetitive overhead arm movements.  Falling onto the shoulder.  Weakness in the shoulder muscles.  What increases the risk? You may be more likely to develop this condition if you are an athlete who participates in:  Sports that involve throwing, such as baseball.  Tennis.  Swimming.  Volleyball.  Some people are also more likely to develop impingement syndrome because of the shape of their acromion bone. What are the  signs or symptoms? The main symptom of this condition is pain on the front or side of the shoulder. Pain may:  Get worse when lifting or raising the arm.  Get worse at night.  Wake you up from sleeping.  Feel sharp when the shoulder is moved, and then fade to an ache.  Other signs and symptoms may include:  Tenderness.  Stiffness.  Inability to raise the arm above shoulder level or behind the body.  Weakness.  How is this diagnosed? This condition may be diagnosed based on:  Your symptoms.  Your medical history.  A physical exam.  Imaging tests, such as: ? X-rays. ? MRI. ? Ultrasound.  How is this treated? Treatment for this condition may include:  Resting your shoulder and avoiding all activities that cause pain or  put stress on the shoulder.  Icing your shoulder.  NSAIDs to help reduce pain and swelling.  One or more injections of medicines to numb the area and reduce inflammation.  Physical therapy.  Surgery. This may be needed if nonsurgical treatments have not helped. Surgery may involve repairing the rotator cuff, reshaping the acromion, or removing the bursa.  Follow these instructions at home: Managing pain, stiffness, and swelling  If directed, apply ice to the injured area. ? Put ice in a plastic bag. ? Place a towel between your skin and the bag. ? Leave the ice on for 20 minutes, 2-3 times a day. Activity  Rest and return to your normal activities as told by your health care provider. Ask your health care provider what activities are safe for you.  Do exercises as told by your health care provider. General instructions  Do not use any tobacco products, including cigarettes, chewing tobacco, or e-cigarettes. Tobacco can delay healing. If you need help quitting, ask your health care provider.  Ask your health care provider when it is safe for you to drive.  Take over-the-counter and prescription medicines only as told by your health care provider.  Keep all follow-up visits as told by your health care provider. This is important. How is this prevented?  Give your body time to rest between periods of activity.  Be safe and responsible while being active to avoid falls.  Maintain physical fitness, including strength and flexibility. Contact a health care provider if:  Your symptoms have not improved after 1-2 months of treatment and rest.  You cannot lift your arm away from your body. This information is not intended to replace advice given to you by your health care provider. Make sure you discuss any questions you have with your health care provider. Document Released: 06/23/2005 Document Revised: 02/28/2016 Document Reviewed: 05/26/2015 Elsevier Interactive Patient  Education  2018 Elsevier Inc.  Shoulder Impingement Syndrome Rehab Ask your health care provider which exercises are safe for you. Do exercises exactly as told by your health care provider and adjust them as directed. It is normal to feel mild stretching, pulling, tightness, or discomfort as you do these exercises, but you should stop right away if you feel sudden pain or your pain gets worse.Do not begin these exercises until told by your health care provider. Stretching and range of motion exercise This exercise warms up your muscles and joints and improves the movement and flexibility of your shoulder. This exercise also helps to relieve pain and stiffness. Exercise A: Passive horizontal adduction  1. Sit or stand and pull your left / right elbow across your chest, toward your other shoulder. Stop when you  feel a gentle stretch in the back of your shoulder and upper arm. ? Keep your arm at shoulder height. ? Keep your arm as close to your body as you comfortably can. 2. Hold for __________ seconds. 3. Slowly return to the starting position. Repeat __________ times. Complete this exercise __________ times a day. Strengthening exercises These exercises build strength and endurance in your shoulder. Endurance is the ability to use your muscles for a long time, even after they get tired. Exercise B: External rotation, isometric 1. Stand or sit in a doorway, facing the door frame. 2. Bend your left / right elbow and place the back of your wrist against the door frame. Only your wrist should be touching the frame. Keep your upper arm at your side. 3. Gently press your wrist against the door frame, as if you are trying to push your arm away from your abdomen. ? Avoid shrugging your shoulder while you press your hand against the door frame. Keep your shoulder blade tucked down toward the middle of your back. 4. Hold for __________ seconds. 5. Slowly release the tension, and relax your muscles  completely before you do the exercise again. Repeat __________ times. Complete this exercise __________ times a day. Exercise C: Internal rotation, isometric  1. Stand or sit in a doorway, facing the door frame. 2. Bend your left / right elbow and place the inside of your wrist against the door frame. Only your wrist should be touching the frame. Keep your upper arm at your side. 3. Gently press your wrist against the door frame, as if you are trying to push your arm toward your abdomen. ? Avoid shrugging your shoulder while you press your hand against the door frame. Keep your shoulder blade tucked down toward the middle of your back. 4. Hold for __________ seconds. 5. Slowly release the tension, and relax your muscles completely before you do the exercise again. Repeat __________ times. Complete this exercise __________ times a day. Exercise D: Scapular protraction, supine  1. Lie on your back on a firm surface. Hold a __________ weight in your left / right hand. 2. Raise your left / right arm straight into the air so your hand is directly above your shoulder joint. 3. Push the weight into the air so your shoulder lifts off of the surface that you are lying on. Do not move your head, neck, or back. 4. Hold for __________ seconds. 5. Slowly return to the starting position. Let your muscles relax completely before you repeat this exercise. Repeat __________ times. Complete this exercise __________ times a day. Exercise E: Scapular retraction  1. Sit in a stable chair without armrests, or stand. 2. Secure an exercise band to a stable object in front of you so the band is at shoulder height. 3. Hold one end of the exercise band in each hand. Your palms should face down. 4. Squeeze your shoulder blades together and move your elbows slightly behind you. Do not shrug your shoulders while you do this. 5. Hold for __________ seconds. 6. Slowly return to the starting position. Repeat __________  times. Complete this exercise __________ times a day. Exercise F: Shoulder extension  1. Sit in a stable chair without armrests, or stand. 2. Secure an exercise band to a stable object in front of you where the band is above shoulder height. 3. Hold one end of the exercise band in each hand. 4. Straighten your elbows and lift your hands up to shoulder height. 5.  Squeeze your shoulder blades together and pull your hands down to the sides of your thighs. Stop when your hands are straight down by your sides. Do not let your hands go behind your body. 6. Hold for __________ seconds. 7. Slowly return to the starting position. Repeat __________ times. Complete this exercise __________ times a day. This information is not intended to replace advice given to you by your health care provider. Make sure you discuss any questions you have with your health care provider. Document Released: 06/23/2005 Document Revised: 02/28/2016 Document Reviewed: 05/26/2015 Elsevier Interactive Patient Education  [employer]2018 [employer] Inc.

## 2018-02-11 ENCOUNTER — Encounter: Payer: Self-pay | Admitting: Family Medicine

## 2018-02-11 ENCOUNTER — Other Ambulatory Visit: Payer: Self-pay

## 2018-02-11 ENCOUNTER — Ambulatory Visit (INDEPENDENT_AMBULATORY_CARE_PROVIDER_SITE_OTHER): Payer: Medicare HMO | Admitting: Physician Assistant

## 2018-02-11 VITALS — BP 122/68 | HR 76 | Temp 98.0°F | Resp 16 | Ht 61.42 in | Wt 128.0 lb

## 2018-02-11 DIAGNOSIS — M25512 Pain in left shoulder: Secondary | ICD-10-CM

## 2018-02-11 DIAGNOSIS — E119 Type 2 diabetes mellitus without complications: Secondary | ICD-10-CM | POA: Diagnosis not present

## 2018-02-11 NOTE — Patient Instructions (Addendum)
You will receive a phone call to schedule an appointment with physical therapy.   Flexeril is a muscle relaxer (see below). This may make you drowsy. If this is not helping your pain, start taking Tylenol #3. Do not take these medications together.   Apply moist heat to the area. Wet a towel and wring it out so it is damp. Put it in the microwave for about 15-20 seconds - long enough to make it hot, but not too hot to apply to your skin causing burns. Do this for about 20-30 minutes, 3-4 times a day.   Perform gentle, light stretches 2-3 times a day.   Put a tennis ball between your back and a wall. Gentle massage the area with rolling the ball around the affected area. Try a foam roller.   Stay well hydrated - try to drink 32-64 oz/day.   If you feel like you need a new pillow, try "My Pillow".  .   Cyclobenzaprine tablets What is this medicine? CYCLOBENZAPRINE (sye kloe BEN za preen) is a muscle relaxer. It is used to treat muscle pain, spasms, and stiffness. This medicine may be used for other purposes; ask your health care provider or pharmacist if you have questions. COMMON BRAND NAME(S): Fexmid, Flexeril What should I tell my health care provider before I take this medicine? They need to know if you have any of these conditions: -heart disease, irregular heartbeat, or previous heart attack -liver disease -thyroid problem -an unusual or allergic reaction to cyclobenzaprine, tricyclic antidepressants, lactose, other medicines, foods, dyes, or preservatives -pregnant or trying to get pregnant -breast-feeding How should I use this medicine? Take this medicine by mouth with a glass of water. Follow the directions on the prescription label. If this medicine upsets your stomach, take it with food or milk. Take your medicine at regular intervals. Do not take it more often than directed. Talk to your pediatrician regarding the use of this medicine in children. Special care may be  needed. Overdosage: If you think you have taken too much of this medicine contact a poison control center or emergency room at once. NOTE: This medicine is only for you. Do not share this medicine with others. What if I miss a dose? If you miss a dose, take it as soon as you can. If it is almost time for your next dose, take only that dose. Do not take double or extra doses. What may interact with this medicine? Do not take this medicine with any of the following medications: -certain medicines for fungal infections like fluconazole, itraconazole, ketoconazole, posaconazole, voriconazole -cisapride -dofetilide -dronedarone -halofantrine -levomethadyl -MAOIs like Carbex, Eldepryl, Marplan, Nardil, and Parnate -narcotic medicines for cough -pimozide -thioridazine -ziprasidone This medicine may also interact with the following medications: -alcohol -antihistamines for allergy, cough and cold -certain medicines for anxiety or sleep -certain medicines for cancer -certain medicines for depression like amitriptyline, fluoxetine, sertraline -certain medicines for infection like alfuzosin, chloroquine, clarithromycin, levofloxacin, mefloquine, pentamidine, troleandomycin -certain medicines for irregular heart beat -certain medicines for seizures like phenobarbital, primidone -contrast dyes -general anesthetics like halothane, isoflurane, methoxyflurane, propofol -local anesthetics like lidocaine, pramoxine, tetracaine -medicines that relax muscles for surgery -narcotic medicines for pain -other medicines that prolong the QT interval (cause an abnormal heart rhythm) -phenothiazines like chlorpromazine, mesoridazine, prochlorperazine This list may not describe all possible interactions. Give your health care provider a list of all the medicines, herbs, non-prescription drugs, or dietary supplements you use. Also tell them if you smoke, drink  alcohol, or use illegal drugs. Some items may  interact with your medicine. What should I watch for while using this medicine? Tell your doctor or health care professional if your symptoms do not start to get better or if they get worse. You may get drowsy or dizzy. Do not drive, use machinery, or do anything that needs mental alertness until you know how this medicine affects you. Do not stand or sit up quickly, especially if you are an older patient. This reduces the risk of dizzy or fainting spells. Alcohol may interfere with the effect of this medicine. Avoid alcoholic drinks. If you are taking another medicine that also causes drowsiness, you may have more side effects. Give your health care provider a list of all medicines you use. Your doctor will tell you how much medicine to take. Do not take more medicine than directed. Call emergency for help if you have problems breathing or unusual sleepiness. Your mouth may get dry. Chewing sugarless gum or sucking hard candy, and drinking plenty of water may help. Contact your doctor if the problem does not go away or is severe. What side effects may I notice from receiving this medicine? Side effects that you should report to your doctor or health care professional as soon as possible: -allergic reactions like skin rash, itching or hives, swelling of the face, lips, or tongue -breathing problems -chest pain -fast, irregular heartbeat -hallucinations -seizures -unusually weak or tired Side effects that usually do not require medical attention (report to your doctor or health care professional if they continue or are bothersome): -headache -nausea, vomiting This list may not describe all possible side effects. Call your doctor for medical advice about side effects. You may report side effects to FDA at 1-800-FDA-1088. Where should I keep my medicine? Keep out of the reach of children. Store at room temperature between 15 and 30 degrees C (59 and 86 degrees F). Keep container tightly closed.  Throw away any unused medicine after the expiration date. NOTE: This sheet is a summary. It may not cover all possible information. If you have questions about this medicine, talk to your doctor, pharmacist, or health care provider.  2018 Elsevier/Gold Standard (2015-04-03 12:05:46)

## 2018-02-12 NOTE — Progress Notes (Signed)
Sharon Peterson  MRN: [id number] DOB: 19-Aug-1950  PCP: Sherren Mocha, MD  Subjective:  Pt is a 67 year old female who presents to clinic for left shoulder pain. This is her third office visit for this problem. No known MOI.   7/9 - pt saw Dr. Alvy Bimler c/o L shoulder pain x 2 weeks. "Sharp, constant" Hurts to pick things up. L shoulder x-ray negative.  Dx with acute bursitis, Rx for diclofenac 75mg .   7/25 - Pt saw Dr. Clelia Croft. No improvement of shoulder pain. No improvement with Diclofenac. Received steroid injection of L shoulder. Dx with subacromial bursitis and impingement syndrome.   Pt did not receive any improvement with steroid injection of L shoulder from her last OV. Today she states her pain is worse at night when she lays down for bed. She sleeps mostly on her left side. She is requesting something for pain.  ROS below.   Review of Systems  Respiratory: Negative for cough, chest tightness, shortness of breath and wheezing.   Cardiovascular: Negative for chest pain and palpitations.  Musculoskeletal: Positive for arthralgias (L shoulder). Negative for joint swelling, neck pain and neck stiffness.  Skin: Negative.   Psychiatric/Behavioral: Positive for sleep disturbance.    Patient Active Problem List   Diagnosis Date Noted  . Acute pain of left shoulder 01/12/2018  . Acute bursitis of left shoulder 01/12/2018  . Vitamin D deficiency 06/02/2015  . Type 2 diabetes mellitus without complication, without long-term current use of insulin (HCC) 06/02/2015  . Osteopenia 06/01/2015  . Hyperlipidemia 06/04/2013  . Tobacco use disorder 06/04/2013    Current Outpatient Medications on File Prior to Visit  Medication Sig Dispense Refill  . aspirin EC 325 MG tablet Take 1 tablet (325 mg total) daily by mouth. 90 tablet 3  . atorvastatin (LIPITOR) 40 MG tablet TAKE 1 TABLET EVERY DAY 90 tablet 3  . Omega-3 Fatty Acids (FISH OIL PO) Take by mouth.     No current  facility-administered medications on file prior to visit.     No Known Allergies   Objective:  BP 122/68   Pulse 76   Temp 98 F (36.7 C) (Oral)   Resp 16   Ht 5' 1.42" (1.56 m)   Wt 128 lb (58.1 kg)   SpO2 98%   BMI 23.86 kg/m   Physical Exam  Constitutional: She is oriented to person, place, and time. No distress.  Musculoskeletal:       Left shoulder: She exhibits decreased range of motion and tenderness. She exhibits no bony tenderness, no swelling, no deformity and normal strength.  Difficult abducting L arm past 90 degrees. No bony tenderness. Acute pain with firm palpation of her lateral latissimus dorsi inferior to L shoulder.    Neurological: She is alert and oriented to person, place, and time.  Skin: Skin is warm and dry.  Psychiatric: Judgment normal.  Vitals reviewed.   Assessment and Plan :  1. Acute pain of left shoulder - Pt presents f/u L shoulder pain refractory to Diclofenac and steroid injection. Negative x-ray on 7/9.   No concerns today for cardiac etiology. Physical exam suggests MSK pain of her latissimus dorsi. Plan to treat with flexeril, if no improvement she may take Tylenol #3. Side effects and safety concerns discussed with pt. She verbalizes understanding. Plan for PT evaluation and treatment. Will consider ortho referral if no improvement.  - Ambulatory referral to Physical Therapy - Flexeril - Tylenol #3  Whitney Joshawa Dubin,  PA-C  [facility] at [facility] 02/12/2018 10:13 AM  Please note: Portions of this report may have been transcribed using dragon voice recognition software. Every effort was made to ensure accuracy; however, inadvertent computerized transcription errors may be present.

## 2018-02-20 ENCOUNTER — Telehealth: Payer: Self-pay | Admitting: Physician Assistant

## 2018-02-20 NOTE — Telephone Encounter (Signed)
Called pt to let them know that their RX has been approved and they should contact their pharmacy.

## 2018-03-01 ENCOUNTER — Ambulatory Visit: Payer: Medicare HMO | Admitting: Physical Therapy

## 2018-03-09 ENCOUNTER — Ambulatory Visit
Admission: RE | Admit: 2018-03-09 | Discharge: 2018-03-09 | Disposition: A | Discharge status: Home / Self Care | Payer: Medicare HMO | Source: Ambulatory Visit | Attending: Family Medicine | Admitting: Family Medicine

## 2018-03-09 DIAGNOSIS — R6889 Other general symptoms and signs: Secondary | ICD-10-CM | POA: Diagnosis not present

## 2018-03-09 DIAGNOSIS — Z1231 Encounter for screening mammogram for malignant neoplasm of breast: Secondary | ICD-10-CM

## 2018-03-11 ENCOUNTER — Other Ambulatory Visit: Payer: Self-pay

## 2018-03-11 ENCOUNTER — Ambulatory Visit: Payer: Medicare HMO | Attending: Physician Assistant | Admitting: Physical Therapy

## 2018-03-11 ENCOUNTER — Encounter: Payer: Self-pay | Admitting: Physical Therapy

## 2018-03-11 DIAGNOSIS — M25612 Stiffness of left shoulder, not elsewhere classified: Secondary | ICD-10-CM

## 2018-03-11 DIAGNOSIS — M6281 Muscle weakness (generalized): Secondary | ICD-10-CM | POA: Diagnosis present

## 2018-03-11 DIAGNOSIS — M25512 Pain in left shoulder: Secondary | ICD-10-CM | POA: Insufficient documentation

## 2018-03-11 DIAGNOSIS — R6889 Other general symptoms and signs: Secondary | ICD-10-CM | POA: Diagnosis not present

## 2018-03-11 NOTE — Patient Instructions (Signed)
    Ice after exercise  Don't OVERuse your arm but also try not to baby it

## 2018-03-11 NOTE — Therapy (Signed)
[facility] [address] [place], [place], [address] Phone: [phone]   Fax:  [phone]  Physical Therapy Evaluation  Patient Details  Name: Sharon Peterson MRN: [id number] Date of Birth: [birth year] Referring Provider:  Hulen Shouts , PA    Encounter Date: 03/11/2018  PT End of Session - 03/11/18 1227    Visit Number  1    Number of Visits  12    Date for PT Re-Evaluation  04/15/18    PT Start Time  1132    PT Stop Time  1210    PT Time Calculation (min)  38 min    Activity Tolerance  Patient tolerated treatment well    Behavior During Therapy  [facility] for tasks assessed/performed       Past Medical History:  Diagnosis Date  . Osteopenia of the elderly 12/16/2013   T score -1.3 at left femur    History reviewed. No pertinent surgical history.  There were no vitals filed for this visit.   Subjective Assessment - 03/11/18 1136    Subjective  Pt noticed increased pain about 4-6 weeks ago.  She is unsure if she lifted too much, does not recall an injury.  She questions of she even needs PT.  She had a cortisone injection but it only helped for a day. She complains of pain, stiffness in L shoulder (points to underarm).  She has difficulty lifting arm overhead, lifting grandbaby, getting coat on and off.  She avoids using her L arm as best she can.      Pertinent History  high cholesterol, DM (but patient says she does not have)     Limitations  House hold activities    Diagnostic tests  XR was normal (01/12/18)    Patient Stated Goals  Pain relief     Currently in Pain?  Yes    Pain Score  2     Pain Location  Shoulder    Pain Orientation  Left;Anterior;Posterior;Other (Comment)   underarm    Pain Descriptors / Indicators  Aching    Pain Type  Chronic pain    Pain Radiating Towards  no    Pain Onset  More than a month ago    Pain Frequency  Intermittent    Aggravating Factors   reaching, using arm     Pain Relieving  Factors  rest, Ibuprofen OTC, ice     Effect of Pain on Daily Activities  limits use of LUE for ADLs.     Multiple Pain Sites  No         [facility] PT Assessment - 03/11/18 0001      Assessment   Medical Diagnosis  L shoulder pain (acute)     Referring Provider   Hulen Shouts , PA     Onset Date/Surgical Date  --   8 weeks    Hand Dominance  Right    Next MD Visit  unsure     Prior Therapy  No       Precautions   Precautions  None      Restrictions   Weight Bearing Restrictions  No      Balance Screen   Has the patient fallen in the past 6 months  No      Home Environment   Living Environment  Private residence    Living Arrangements  Other (Comment)    Type of Home  Apartment    Additional Comments  Grandchild and her  baby live with her       Prior Function   Level of Independence  Independent    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[employer] , shows      Cognition   Overall Cognitive Status  Within Functional Limits for tasks assessed    Behaviors  Other (comment)   cooperative but guarded      Observation/Other Assessments   Focus on Therapeutic Outcomes (FOTO)   NT      Sensation   Light Touch  Appears Intact      Coordination   Gross Motor Movements are Fluid and Coordinated  Not tested      Posture/Postural Control   Posture/Postural Control  Postural limitations    Posture Comments  none remarkable      AROM   Overall AROM Comments  unable to elevate on L as Rt side    poor retraction   Right Shoulder Extension  70 Degrees    Right Shoulder Flexion  160 Degrees    Right Shoulder ABduction  160 Degrees    Right Shoulder Internal Rotation  --   FR to upper T spine    Right Shoulder External Rotation  --   FR to T1   Left Shoulder Extension  37 Degrees    Left Shoulder Flexion  120 Degrees   pain    Left Shoulder ABduction  80 Degrees   pain   Left Shoulder Internal Rotation  50 Degrees   FR lumbar    Left  Shoulder External Rotation  14 Degrees   FR back of neck, elbow in      PROM   Overall PROM Comments  painful all planes: flexion 130, abd to 90deg, ER to 20 deg and IR 70 min pain       Strength   Right Shoulder Flexion  4/5    Right Shoulder ABduction  4/5    Right Shoulder Internal Rotation  4+/5   pain    Right Shoulder External Rotation  4/5   pain    Left Shoulder Flexion  3+/5   pain   Left Shoulder ABduction  3/5   pain    Left Shoulder Internal Rotation  4+/5   pain   Left Shoulder External Rotation  4/5   pain    Right/Left Elbow  --   4+/5     Palpation   Palpation comment  acutely tender L anterior aspect of shoulder at coracoid process , also posteriorly at teres minor, subscapularis      Special Tests    Special Tests  Rotator Cuff Impingement                Objective measurements completed on examination: See above findings.      [facility] Adult PT Treatment/Exercise - 03/11/18 0001      Shoulder Exercises: Seated   Retraction  Strengthening;Both;5 reps      Shoulder Exercises: Standing   Row  Strengthening;10 reps    Theraband Level (Shoulder Row)  Level 2 (Red)      Shoulder Exercises: Stretch   External Rotation Stretch  1 rep;30 seconds             PT Education - 03/11/18 1227    Education Details  PT/POC, HEP, indication for PT, RICE     Person(s) Educated  Patient    Methods  Explanation;Demonstration;Handout    Comprehension  Verbalized understanding;Returned  demonstration          PT Long Term Goals - 03/11/18 1240      PT LONG TERM GOAL #1   Title  Pt will be I with HEP     Time  6    Period  Weeks    Status  New    Target Date  04/15/18      PT LONG TERM GOAL #2   Title  Pt will understand RICE, lifting and posture to prevent further injury.     Time  6    Period  Weeks    Status  New    Target Date  04/15/18      PT LONG TERM GOAL #3   Title  Pt will be able to lift grandbaby using both arms with 25%  less pain     Time  6    Period  Weeks    Status  New    Target Date  04/15/18      PT LONG TERM GOAL #4   Title  Pt will reach overhead with pain no more than 5/10 for home tasks, ADLs    Time  6    Period  Weeks    Status  New    Target Date  04/15/18             Plan - 03/11/18 1251    Clinical Impression Statement  Patient presents for low complexity eval of acute L shoulder pain without known injury.  She had min pain at rest, but unable to tolerate PROM.  Shows signs and symptoms consistent with adhesive capsulitis vs subacromial bursitis.  Special testing positive throughout, not reliable. She was somethat resistant to the idea of PT and needed encouragement to performed prescribed exercises.    Declined modalities for pain .      Clinical Presentation  Stable    Clinical Decision Making  Low    Rehab Potential  Good    PT Frequency  2x / week    PT Duration  6 weeks    PT Treatment/Interventions  ADLs/Self Care Home Management;Electrical Stimulation;Functional mobility training;Neuromuscular re-education;Therapeutic activities;Ultrasound;Cryotherapy;Iontophoresis 4mg /ml Dexamethasone;Therapeutic exercise;Patient/family education;Manual techniques;Passive range of motion;Taping    PT Next Visit Plan  check HEP, modalities for pain, [place]?     PT Home Exercise Plan  ER stretch, row red band, pendulums, retraction     Consulted and Agree with Plan of Care  Patient       Patient will benefit from skilled therapeutic intervention in order to improve the following deficits and impairments:  Pain, Impaired UE functional use, Impaired flexibility, Increased fascial restricitons, Difficulty walking, Decreased mobility, Decreased strength, Decreased range of motion  Visit Diagnosis: Acute pain of left shoulder  Muscle weakness (generalized)  Stiffness of left shoulder, not elsewhere classified     Problem List Patient Active Problem List   Diagnosis Date Noted  . Acute  pain of left shoulder 01/12/2018  . Acute bursitis of left shoulder 01/12/2018  . Vitamin D deficiency 06/02/2015  . Type 2 diabetes mellitus without complication, without long-term current use of insulin (HCC) 06/02/2015  . Osteopenia 06/01/2015  . Hyperlipidemia 06/04/2013  . Tobacco use disorder 06/04/2013    PAA,JENNIFER 03/11/2018, 1:05 PM  [facility] [address] [place], [place], [address] Phone: [phone]   Fax:  [phone]  Name: NIKOLA BLACKSTON MRN: [id number] Date of Birth: 25-Apr-1951   Karie Mainland, PT 03/11/18 1:05 PM Phone:  [phone] Fax: [phone]  

## 2018-03-24 ENCOUNTER — Encounter: Payer: Medicare HMO | Admitting: Physical Therapy

## 2018-06-06 IMAGING — DX DG CHEST 2V
2 series · 2 of 2 positions shown · non-contrast
Comparison: 08/25/2006.

CLINICAL DATA: Fatigue.  Decreased exercise tolerance.

EXAM:
CHEST  2 VIEW

[chest pa]
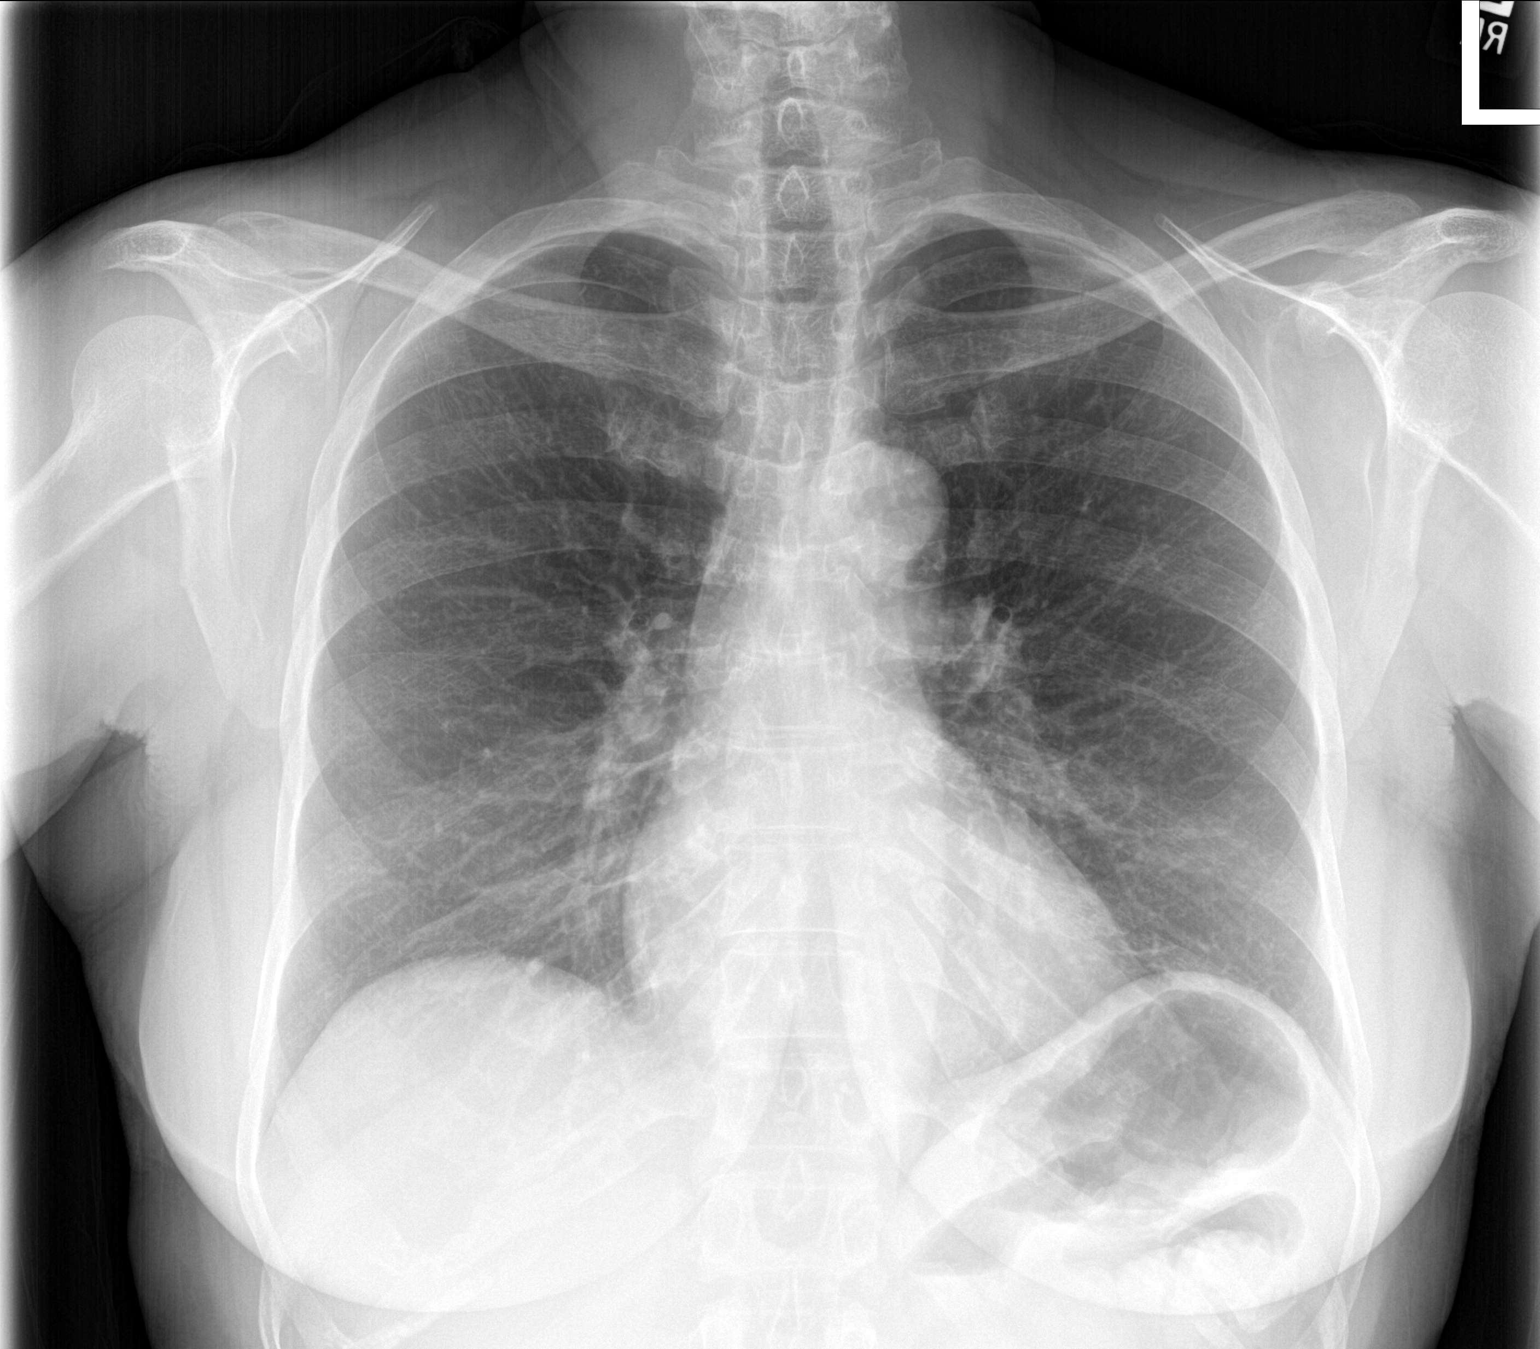

[chest lat]
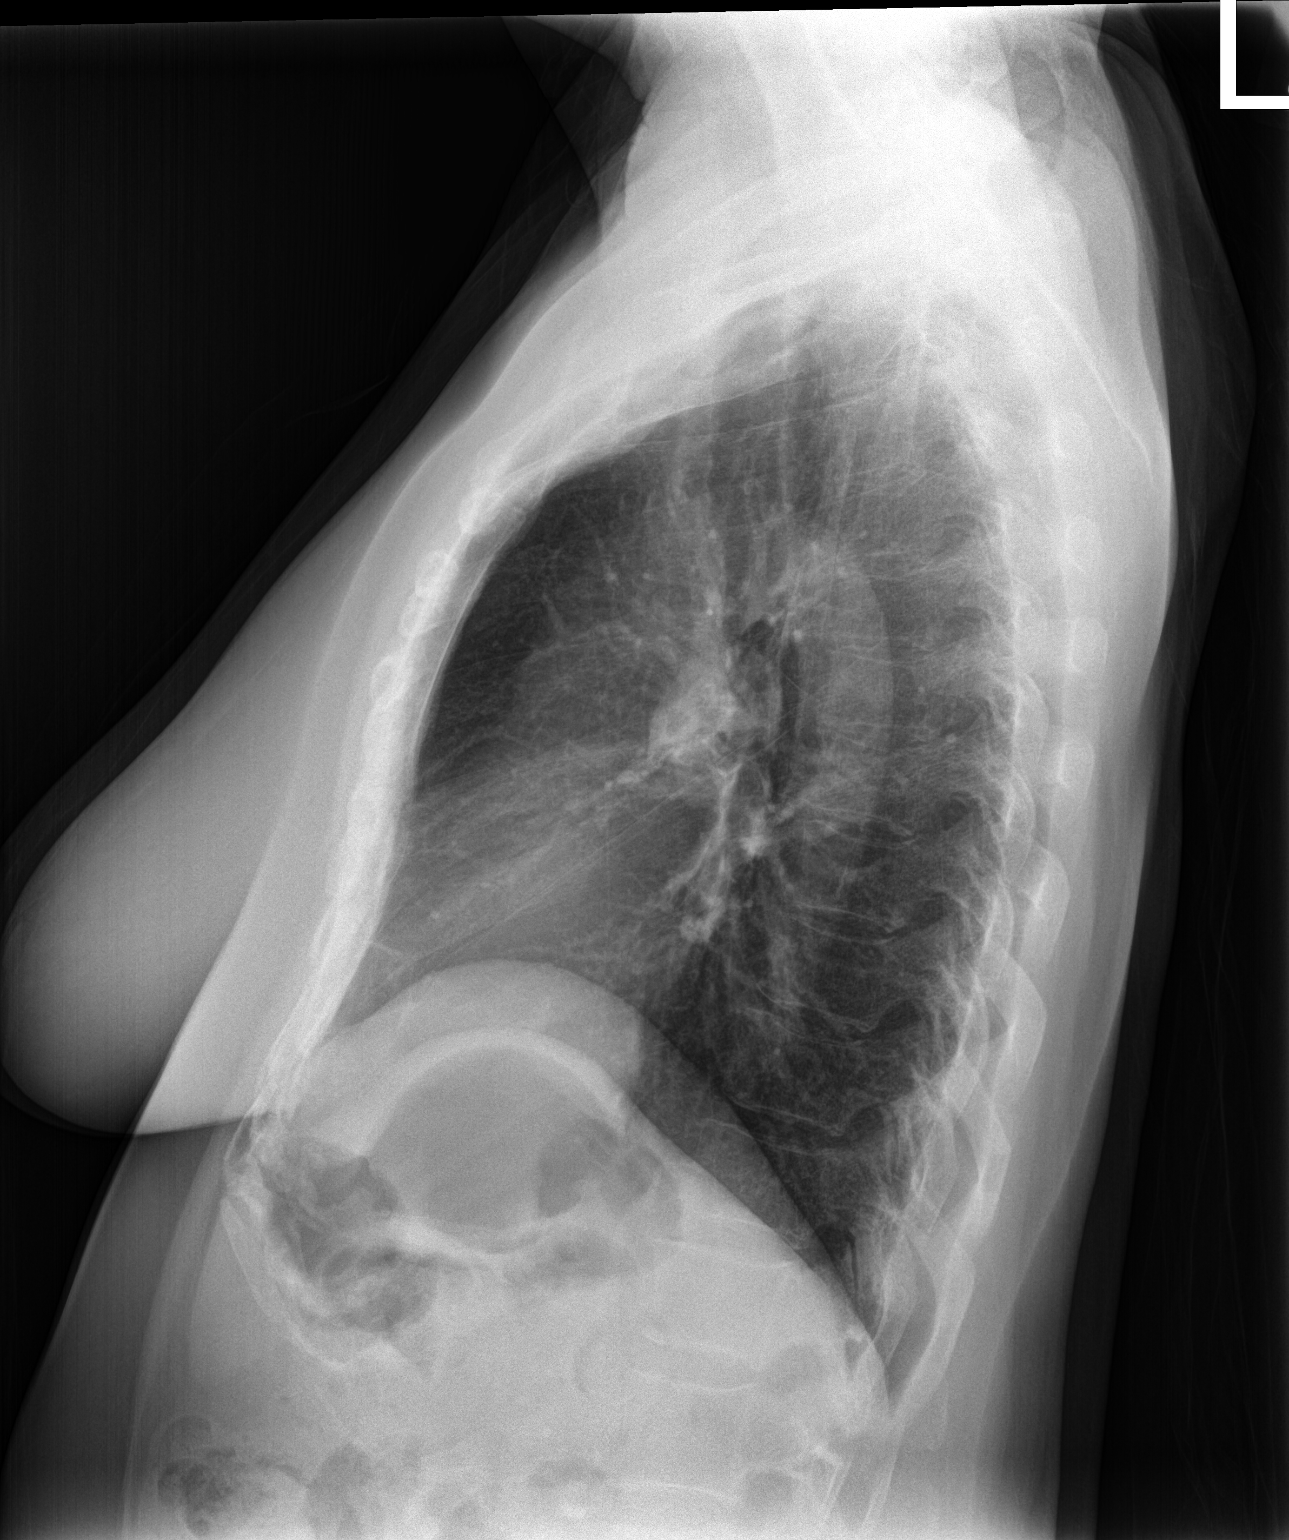

[2 of 2 positions shown; findings below may reference images not displayed]

FINDINGS: Mediastinum hilar structures normal. Lungs are clear. No focal
infiltrate. No pleural effusion or pneumothorax. Degenerative
changes thoracic spine. Diffuse thoracic spine osteopenia. Stable
mild mid thoracic vertebral body compression fracture.
IMPRESSION: No acute cardiopulmonary disease.

## 2018-08-24 ENCOUNTER — Other Ambulatory Visit: Payer: Self-pay | Admitting: Family Medicine

## 2018-08-24 MED ORDER — ATORVASTATIN CALCIUM 40 MG PO TABS: ORAL_TABLET | ORAL | 0 refills | Status: DC

## 2018-08-24 NOTE — Telephone Encounter (Signed)
Pt will need a new prescription to be sent to [facility] pharmacy for: Accu Check [facility] & Test Strips Soft Clix Lancets Accu Check Azizo Solution BD Single use swabs

## 2018-08-24 NOTE — Telephone Encounter (Signed)
Copied from CRM [id number]. Topic: Quick Communication - See Telephone Encounter >> Aug 24, 2018 12:43 PM Terisa Starr wrote: CRM for notification. See Telephone encounter for: 08/24/18.  Shanda Bumps with [facility] called for the following refills::  atorvastatin (LIPITOR) 40 MG tablet Accu Check [facility] & Test Strips Soft Clix Lancets Accu Check Azizo Solution BD Single use swabs  [facility] - [place], [place] - [address] [place] [address] Phone: [phone] Fax: [phone]

## 2018-08-24 NOTE — Telephone Encounter (Signed)
Requested medication (s) are due for refill today - Rx written 11/26/16  Requested medication (s) are on the active medication list -yes  Future visit scheduled -no  Last refill: written 11/26/16- last filled 1 year ago  Notes to clinic: patient is requesting new prescriptions- Lipitor, Accu check Aziza meter ,lancets, strips,solution, alcohol swabs  Requested Prescriptions  Pending Prescriptions Disp Refills   atorvastatin (LIPITOR) 40 MG tablet 90 tablet 3    Sig: TAKE 1 TABLET EVERY DAY     Cardiovascular:  Antilipid - Statins Failed - 08/24/2018 12:53 PM      Failed - Total Cholesterol in normal range and within 360 days    Cholesterol  Date Value Ref Range Status  05/22/2016 293 (H) <200 mg/dL Final    Comment:    ** Please note change in reference range(s). **            Failed - LDL in normal range and within 360 days    LDL Cholesterol  Date Value Ref Range Status  05/22/2016 208 (H) <100 mg/dL Final    Comment:    ** Please note change in reference range(s). **            Failed - HDL in normal range and within 360 days    HDL  Date Value Ref Range Status  05/22/2016 46 (L) >50 mg/dL Final    Comment:    ** Please note change in reference range(s). **            Failed - Triglycerides in normal range and within 360 days    Triglycerides  Date Value Ref Range Status  05/22/2016 195 (H) <150 mg/dL Final    Comment:    ** Please note change in reference range(s). **            Passed - Patient is not pregnant      Passed - Valid encounter within last 12 months    Recent Outpatient Visits          6 months ago Acute pain of left shoulder   [facility] at [facility], Madelaine Bhat, PA-C   6 months ago Subacromial bursitis of left shoulder joint   [facility], Levell July, MD   7 months ago Acute pain of left shoulder   [facility] at [facility], Eilleen Kempf, MD   1 year ago Need for prophylactic vaccination and inoculation  against influenza   [facility], Levell July, MD   1 year ago Type 2 diabetes mellitus without complication, without long-term current use of insulin [facility])   [facility], Levell July, MD              Requested Prescriptions  Pending Prescriptions Disp Refills   atorvastatin (LIPITOR) 40 MG tablet 90 tablet 3    Sig: TAKE 1 TABLET EVERY DAY     Cardiovascular:  Antilipid - Statins Failed - 08/24/2018 12:53 PM      Failed - Total Cholesterol in normal range and within 360 days    Cholesterol  Date Value Ref Range Status  05/22/2016 293 (H) <200 mg/dL Final    Comment:    ** Please note change in reference range(s). **            Failed - LDL in normal range and within 360 days    LDL Cholesterol  Date Value Ref Range Status  05/22/2016 208 (H) <100 mg/dL  Final    Comment:    ** Please note change in reference range(s). **            Failed - HDL in normal range and within 360 days    HDL  Date Value Ref Range Status  05/22/2016 46 (L) >50 mg/dL Final    Comment:    ** Please note change in reference range(s). **            Failed - Triglycerides in normal range and within 360 days    Triglycerides  Date Value Ref Range Status  05/22/2016 195 (H) <150 mg/dL Final    Comment:    ** Please note change in reference range(s). **            Passed - Patient is not pregnant      Passed - Valid encounter within last 12 months    Recent Outpatient Visits          6 months ago Acute pain of left shoulder   [facility] at [facility], Madelaine Bhat, PA-C   6 months ago Subacromial bursitis of left shoulder joint   [facility], Levell July, MD   7 months ago Acute pain of left shoulder   Primary Care at [facility], Eilleen Kempf, MD   1 year ago Need for prophylactic vaccination and inoculation against influenza   [facility] at [facility], Levell July, MD   1 year ago Type 2 diabetes mellitus without complication, without  long-term current use of insulin [facility])   [facility] at [facility], Levell July, MD

## 2018-09-18 ENCOUNTER — Other Ambulatory Visit: Payer: Self-pay | Admitting: Family Medicine

## 2018-09-20 ENCOUNTER — Other Ambulatory Visit: Payer: Self-pay

## 2018-09-20 NOTE — Telephone Encounter (Signed)
Patient called and advised she will need to schedule with a new provider at [facility], since Dr. Clelia Croft has left the practice, or follow with Dr. Clelia Croft after 10/30/18 at her new location. Patient agrees to stay at [facility], appointment scheduled for Friday, 09/24/18 at 1320 with Dr. Leretha Pol.

## 2018-09-20 NOTE — Telephone Encounter (Signed)
Courtesy refill until appointment  

## 2018-09-24 ENCOUNTER — Encounter: Payer: Self-pay | Admitting: Family Medicine

## 2018-09-24 ENCOUNTER — Ambulatory Visit (INDEPENDENT_AMBULATORY_CARE_PROVIDER_SITE_OTHER): Payer: Medicare HMO | Admitting: Family Medicine

## 2018-09-24 ENCOUNTER — Other Ambulatory Visit: Payer: Self-pay

## 2018-09-24 VITALS — BP 140/90 | HR 70 | Temp 97.9°F | Ht 61.42 in | Wt 124.0 lb

## 2018-09-24 DIAGNOSIS — Z23 Encounter for immunization: Secondary | ICD-10-CM

## 2018-09-24 DIAGNOSIS — F17210 Nicotine dependence, cigarettes, uncomplicated: Secondary | ICD-10-CM | POA: Diagnosis not present

## 2018-09-24 DIAGNOSIS — E785 Hyperlipidemia, unspecified: Secondary | ICD-10-CM

## 2018-09-24 DIAGNOSIS — E119 Type 2 diabetes mellitus without complications: Secondary | ICD-10-CM

## 2018-09-24 LAB — POCT GLYCOSYLATED HEMOGLOBIN (HGB A1C): Hemoglobin A1C: 6.8 % — AB (ref 4.0–5.6)

## 2018-09-24 MED ORDER — ATORVASTATIN CALCIUM 40 MG PO TABS: ORAL_TABLET | ORAL | 1 refills | Status: DC

## 2018-09-24 NOTE — Patient Instructions (Addendum)
Please come in 3-4 days before our next appointment for fasting labs    If you have lab work done today you will be contacted with your lab results within the next 2 weeks.  If you have not heard from [place] then please contact us. The fastest way to get your results is to register for My Chart.   IF you received an x-ray today, you will receive an invoice from [facility]. Please contact [facility] at [phone] with questions or concerns regarding your invoice.   IF you received labwork today, you will receive an invoice from [place]. Please contact [facility] at [phone] with questions or concerns regarding your invoice.   Our billing staff will not be able to assist you with questions regarding bills from these companies.  You will be contacted with the lab results as soon as they are available. The fastest way to get your results is to activate your My Chart account. Instructions are located on the last page of this paperwork. If you have not heard from [place] regarding the results in 2 weeks, please contact this office.

## 2018-09-24 NOTE — Progress Notes (Signed)
3/20/20201:48 PM  Sharon Peterson 05-04-1951, 68 y.o., female [id number]  Chief Complaint  Patient presents with  . Hypertension    needd lipitor refilled, did not know that wshe was a diabetic until i spoke with her about her a1c. Says she has noticed how she is craving for sugary food    HPI:   Patient is a 67 y.o. female with past medical history significant for DM2, HLP who presents today to establish care   Previous PCP Dr Brigitte Pulse Wears eyeglasses, recent cataract surgery in June 2019, sees [facility].  Tolerates lipitor well, takes it daily Needs refills but has not ran out Smokes 1/2 ppd, used to smoke 1 ppd Smoking calms her nerves Not interested in [place] for her 2 young great grandsons, age 41 and 3  Lab Results  Component Value Date   HGBA1C 6.8 (A) 09/24/2018   HGBA1C 6.4 05/14/2017   HGBA1C 6.6 11/26/2016   Lab Results  Component Value Date   MICROALBUR 0.7 11/29/2015   LDLCALC 208 (H) 05/22/2016   CREATININE 0.88 05/14/2017     Fall Risk  09/24/2018 02/11/2018 01/28/2018 01/12/2018 05/14/2017  Falls in the past year? 0 No No No No  Number falls in past yr: 0 - - - -  Injury with Fall? 0 - - - -     Depression screen [facility] 2/9 09/24/2018 02/11/2018 01/28/2018  Decreased Interest 0 0 0  Down, Depressed, Hopeless 0 0 0  PHQ - 2 Score 0 0 0    No Known Allergies  Prior to Admission medications   Medication Sig Start Date End Date Taking? Authorizing Provider  atorvastatin (LIPITOR) 40 MG tablet TAKE 1 TABLET EVERY DAY (Keep 09/24/18 appointment for additional refills) 09/20/18  Yes Rutherford Guys, MD  Omega-3 Fatty Acids (FISH OIL PO) Take by mouth.   Yes [provider]    Past Medical History:  Diagnosis Date  . Osteopenia of the elderly 12/16/2013   T score -1.3 at left femur    History reviewed. No pertinent surgical history.  Social History   Tobacco Use  . Smoking status: Current Every Day Smoker    Packs/day: 0.60   Years: 56.00    Pack years: 33.60  . Smokeless tobacco: Never Used  Substance Use Topics  . Alcohol use: No    History reviewed. No pertinent family history.  Review of Systems  Constitutional: Negative for chills and fever.  Respiratory: Negative for cough and shortness of breath.   Cardiovascular: Negative for chest pain, palpitations and leg swelling.  Gastrointestinal: Negative for abdominal pain, nausea and vomiting.     OBJECTIVE:  Today's Vitals   09/24/18 1326  BP: 140/90  Pulse: 70  Temp: 97.9 F (36.6 C)  TempSrc: Oral  SpO2: 98%  Weight: 124 lb (56.2 kg)  Height: 5' 1.42" (1.56 m)   Body mass index is 23.11 kg/m.   Physical Exam Vitals signs and nursing note reviewed.  Constitutional:      Appearance: She is well-developed.  HENT:     Head: Normocephalic and atraumatic.     Mouth/Throat:     Pharynx: No oropharyngeal exudate.  Eyes:     General: No scleral icterus.    Conjunctiva/sclera: Conjunctivae normal.     Pupils: Pupils are equal, round, and reactive to light.  Neck:     Musculoskeletal: Neck supple.  Cardiovascular:     Rate and Rhythm: Normal rate and regular rhythm.  Heart sounds: Normal heart sounds. No murmur. No friction rub. No gallop.   Pulmonary:     Effort: Pulmonary effort is normal.     Breath sounds: Normal breath sounds. No wheezing or rales.  Skin:    General: Skin is warm and dry.  Neurological:     Mental Status: She is alert and oriented to person, place, and time.    Diabetic Foot Exam - Simple   Simple Foot Form Visual Inspection No deformities, no ulcerations, no other skin breakdown bilaterally:  Yes Sensation Testing Intact to touch and monofilament testing bilaterally:  Yes Pulse Check Comments Felt all prrcks with the filament, no broken skin     Results for orders placed or performed in visit on 09/24/18 (from the past 24 hour(s))  POCT glycosylated hemoglobin (Hb A1C)     Status: Abnormal    Collection Time: 09/24/18  1:48 PM  Result Value Ref Range   Hemoglobin A1C 6.8 (A) 4.0 - 5.6 %   HbA1c POC (<> result, manual entry)     HbA1c, POC (prediabetic range)     HbA1c, POC (controlled diabetic range)       ASSESSMENT and PLAN  1. Hyperlipidemia, unspecified hyperlipidemia type Checking labs today, medications will be adjusted as needed.  - Lipid panel - TSH - CMP14+EGFR - Microalbumin / creatinine urine ratio - Comprehensive metabolic panel; Future - Lipid panel; Future  2. Type 2 diabetes mellitus without complication, without long-term current use of insulin (HCC) Diet controlled. ROI for eye exam done today - Lipid panel - TSH - POCT glycosylated hemoglobin (Hb A1C) - CMP14+EGFR - Microalbumin / creatinine urine ratio - HM DIABETES FOOT EXAM - Hemoglobin A1c; Future  3. Need for prophylactic vaccination and inoculation against influenza - Flu Vaccine QUAD 36+ mos IM  4. Cigarette smoker Not interested in quiting  Other orders - atorvastatin (LIPITOR) 40 MG tablet; TAKE 1 TABLET EVERY DAY    Return in about 6 months (around 03/27/2019).    Rutherford Guys, MD [facility] at [facility], [place] [address] Ph.  [phone] Fax [phone]

## 2018-09-25 LAB — CMP14+EGFR
ALT: 10 IU/L (ref 0–32)
AST: 18 IU/L (ref 0–40)
Albumin/Globulin Ratio: 1.6 (ref 1.2–2.2)
Albumin: 4.7 g/dL (ref 3.8–4.8)
Alkaline Phosphatase: 107 IU/L (ref 39–117)
BUN/Creatinine Ratio: 15 (ref 12–28)
BUN: 13 mg/dL (ref 8–27)
Bilirubin Total: <0.2 mg/dL (ref 0.0–1.2)
CO2: 20 mmol/L (ref 20–29)
Calcium: 9.8 mg/dL (ref 8.7–10.3)
Chloride: 103 mmol/L (ref 96–106)
Creatinine, Ser: 0.89 mg/dL (ref 0.57–1.00)
GFR calc Af Amer: 76 mL/min/{1.73_m2} (ref >59)
GFR calc non Af Amer: 66 mL/min/{1.73_m2} (ref >59)
Globulin, Total: 3 g/dL (ref 1.5–4.5)
Glucose: 110 mg/dL — ABNORMAL HIGH (ref 65–99)
Potassium: 3.9 mmol/L (ref 3.5–5.2)
Sodium: 141 mmol/L (ref 134–144)
Total Protein: 7.7 g/dL (ref 6.0–8.5)

## 2018-09-25 LAB — TSH: TSH: 1.15 u[IU]/mL (ref 0.450–4.500)

## 2018-09-25 LAB — LIPID PANEL
Chol/HDL Ratio: 3.1 ratio (ref 0.0–4.4)
Cholesterol, Total: 151 mg/dL (ref 100–199)
HDL: 49 mg/dL (ref >39)
LDL Calculated: 75 mg/dL (ref 0–99)
Triglycerides: 134 mg/dL (ref 0–149)
VLDL Cholesterol Cal: 27 mg/dL (ref 5–40)

## 2018-09-25 LAB — MICROALBUMIN / CREATININE URINE RATIO
Creatinine, Urine: 16.3 mg/dL
Microalb/Creat Ratio: <18 mg/g creat (ref 0–29)
Microalbumin, Urine: <3 ug/mL

## 2018-10-19 ENCOUNTER — Encounter: Payer: Self-pay | Admitting: Radiology

## 2019-01-28 ENCOUNTER — Other Ambulatory Visit: Payer: Self-pay | Admitting: Family Medicine

## 2019-01-28 DIAGNOSIS — Z1231 Encounter for screening mammogram for malignant neoplasm of breast: Secondary | ICD-10-CM

## 2019-02-09 ENCOUNTER — Other Ambulatory Visit: Payer: Self-pay | Admitting: Family Medicine

## 2019-02-09 NOTE — Telephone Encounter (Signed)
Requested Prescriptions  Pending Prescriptions Disp Refills  . atorvastatin (LIPITOR) 40 MG tablet [Pharmacy Med Name: ATORVASTATIN CALCIUM 40 MG Tablet] 90 tablet 0    Sig: TAKE 1 TABLET EVERY DAY     Cardiovascular:  Antilipid - Statins Passed - 02/09/2019  2:16 PM      Passed - Total Cholesterol in normal range and within 360 days    Cholesterol, Total  Date Value Ref Range Status  09/24/2018 151 100 - 199 mg/dL Final         Passed - LDL in normal range and within 360 days    LDL Calculated  Date Value Ref Range Status  09/24/2018 75 0 - 99 mg/dL Final         Passed - HDL in normal range and within 360 days    HDL  Date Value Ref Range Status  09/24/2018 49 >39 mg/dL Final         Passed - Triglycerides in normal range and within 360 days    Triglycerides  Date Value Ref Range Status  09/24/2018 134 0 - 149 mg/dL Final         Passed - Patient is not pregnant      Passed - Valid encounter within last 12 months    Recent Outpatient Visits          4 months ago Hyperlipidemia, unspecified hyperlipidemia type   [facility], Lilia Argue, MD   12 months ago Acute pain of left shoulder   [facility] at [facility], Gelene Mink, PA-C   1 year ago Subacromial bursitis of left shoulder joint   [facility], Laurey Arrow, MD   1 year ago Acute pain of left shoulder   Primary Care at [facility], Ines Bloomer, MD   1 year ago Need for prophylactic vaccination and inoculation against influenza   [facility], Laurey Arrow, MD

## 2019-03-08 DIAGNOSIS — H4603 Optic papillitis, bilateral: Secondary | ICD-10-CM | POA: Diagnosis not present

## 2019-03-08 DIAGNOSIS — Z961 Presence of intraocular lens: Secondary | ICD-10-CM | POA: Diagnosis not present

## 2019-03-08 DIAGNOSIS — H527 Unspecified disorder of refraction: Secondary | ICD-10-CM | POA: Diagnosis not present

## 2019-03-16 ENCOUNTER — Other Ambulatory Visit: Payer: Self-pay

## 2019-03-16 ENCOUNTER — Ambulatory Visit
Admission: RE | Admit: 2019-03-16 | Discharge: 2019-03-16 | Disposition: A | Discharge status: Home / Self Care | Payer: Medicare HMO | Source: Ambulatory Visit | Attending: Family Medicine | Admitting: Family Medicine

## 2019-03-16 DIAGNOSIS — Z1231 Encounter for screening mammogram for malignant neoplasm of breast: Secondary | ICD-10-CM

## 2019-03-30 ENCOUNTER — Other Ambulatory Visit: Payer: Self-pay

## 2019-03-30 ENCOUNTER — Ambulatory Visit (INDEPENDENT_AMBULATORY_CARE_PROVIDER_SITE_OTHER): Payer: Medicare HMO | Admitting: Family Medicine

## 2019-03-30 VITALS — BP 136/71 | HR 79 | Temp 98.6°F | Resp 16 | Ht 59.0 in | Wt 122.2 lb

## 2019-03-30 DIAGNOSIS — E119 Type 2 diabetes mellitus without complications: Secondary | ICD-10-CM

## 2019-03-30 DIAGNOSIS — E785 Hyperlipidemia, unspecified: Secondary | ICD-10-CM

## 2019-03-30 DIAGNOSIS — Z0001 Encounter for general adult medical examination with abnormal findings: Secondary | ICD-10-CM

## 2019-03-30 DIAGNOSIS — Z23 Encounter for immunization: Secondary | ICD-10-CM | POA: Diagnosis not present

## 2019-03-30 NOTE — Patient Instructions (Signed)
Thank you for taking time to come for your Medicare Wellness Visit. I appreciate your ongoing commitment to your health goals. Please review the following plan we discussed and let me know if I can assist you in the future.  Marlayna Bannister LPN  Preventive Care 70 Years and Older, Female Preventive care refers to lifestyle choices and visits with your health care provider that can promote health and wellness. This includes:  A yearly physical exam. This is also called an annual well check.  Regular dental and eye exams.  Immunizations.  Screening for certain conditions.  Healthy lifestyle choices, such as diet and exercise. What can I expect for my preventive care visit? Physical exam Your health care provider will check:  Height and weight. These may be used to calculate body mass index (BMI), which is a measurement that tells if you are at a healthy weight.  Heart rate and blood pressure.  Your skin for abnormal spots. Counseling Your health care provider may ask you questions about:  Alcohol, tobacco, and drug use.  Emotional well-being.  Home and relationship well-being.  Sexual activity.  Eating habits.  History of falls.  Memory and ability to understand (cognition).  Work and work environment.  Pregnancy and menstrual history. What immunizations do I need?  Influenza (flu) vaccine  This is recommended every year. Tetanus, diphtheria, and pertussis (Tdap) vaccine  You may need a Td booster every 10 years. Varicella (chickenpox) vaccine  You may need this vaccine if you have not already been vaccinated. Zoster (shingles) vaccine  You may need this after age 60. Pneumococcal conjugate (PCV13) vaccine  One dose is recommended after age 70. Pneumococcal polysaccharide (PPSV23) vaccine  One dose is recommended after age 70. Measles, mumps, and rubella (MMR) vaccine  You may need at least one dose of MMR if you were born in 1957 or later. You may also  need a second dose. Meningococcal conjugate (MenACWY) vaccine  You may need this if you have certain conditions. Hepatitis A vaccine  You may need this if you have certain conditions or if you travel or work in places where you may be exposed to hepatitis A. Hepatitis B vaccine  You may need this if you have certain conditions or if you travel or work in places where you may be exposed to hepatitis B. Haemophilus influenzae type b (Hib) vaccine  You may need this if you have certain conditions. You may receive vaccines as individual doses or as more than one vaccine together in one shot (combination vaccines). Talk with your health care provider about the risks and benefits of combination vaccines. What tests do I need? Blood tests  Lipid and cholesterol levels. These may be checked every 5 years, or more frequently depending on your overall health.  Hepatitis C test.  Hepatitis B test. Screening  Lung cancer screening. You may have this screening every year starting at age 70 if you have a 30-pack-year history of smoking and currently smoke or have quit within the past 15 years.  Colorectal cancer screening. All adults should have this screening starting at age 70 and continuing until age 75. Your health care provider may recommend screening at age 45 if you are at increased risk. You will have tests every 1-10 years, depending on your results and the type of screening test.  Diabetes screening. This is done by checking your blood sugar (glucose) after you have not eaten for a while (fasting). You may have this done every 1-3   years.  Mammogram. This may be done every 1-2 years. Talk with your health care provider about how often you should have regular mammograms.  BRCA-related cancer screening. This may be done if you have a family history of breast, ovarian, tubal, or peritoneal cancers. Other tests  Sexually transmitted disease (STD) testing.  Bone density scan. This is done  to screen for osteoporosis. You may have this done starting at age 70. Follow these instructions at home: Eating and drinking  Eat a diet that includes fresh fruits and vegetables, whole grains, lean protein, and low-fat dairy products. Limit your intake of foods with high amounts of sugar, saturated fats, and salt.  Take vitamin and mineral supplements as recommended by your health care provider.  Do not drink alcohol if your health care provider tells you not to drink.  If you drink alcohol: ? Limit how much you have to 0-1 drink a day. ? Be aware of how much alcohol is in your drink. In the [place] one drink equals one 12 oz bottle of beer (355 mL), one 5 oz glass of wine (148 mL), or one 1 oz glass of hard liquor (44 mL). Lifestyle  Take daily care of your teeth and gums.  Stay active. Exercise for at least 30 minutes on 5 or more days each week.  Do not use any products that contain nicotine or tobacco, such as cigarettes, e-cigarettes, and chewing tobacco. If you need help quitting, ask your health care provider.  If you are sexually active, practice safe sex. Use a condom or other form of protection in order to prevent STIs (sexually transmitted infections).  Talk with your health care provider about taking a low-dose aspirin or statin. What's next?  Go to your health care provider once a year for a well check visit.  Ask your health care provider how often you should have your eyes and teeth checked.  Stay up to date on all vaccines. This information is not intended to replace advice given to you by your health care provider. Make sure you discuss any questions you have with your health care provider. Document Released: 07/20/2015 Document Revised: 06/17/2018 Document Reviewed: 06/17/2018 Elsevier Patient Education  2020 [employer].

## 2019-03-30 NOTE — Progress Notes (Signed)
Presents today for [employer] Visit   Date of last exam: 09/24/2018  Interpreter used for this visit?   No   Patient was seen in clinic   Patient Care Team: Myles Lipps, MD as PCP - General (Family Medicine)   Other items to address today:   Discussed Immunizations Discussed Eye/Dental   Other Screening: Last screening for diabetes Last lipid screening: 09/24/2018  ADVANCE DIRECTIVES: Discussed: yes On File: no Materials Provided: yes   Immunization status:  Immunization History  Administered Date(s) Administered  . Influenza, High Dose Seasonal PF 09/24/2018  . Influenza,inj,Quad PF,6+ Mos 05/27/2013, 06/01/2015, 05/22/2016, 05/14/2017  . Pneumococcal Conjugate-13 11/29/2015  . Tdap 07/29/2013     Health Maintenance Due  Topic Date Due  . INFLUENZA VACCINE  02/05/2019  . HEMOGLOBIN A1C  03/27/2019     Functional Status Survey: Is the patient deaf or have difficulty hearing?: No Does the patient have difficulty seeing, even when wearing glasses/contacts?: No Does the patient have difficulty concentrating, remembering, or making decisions?: No Does the patient have difficulty walking or climbing stairs?: No Does the patient have difficulty dressing or bathing?: No Does the patient have difficulty doing errands alone such as visiting a doctor's office or shopping?: No   6CIT Screen 03/30/2019  What Year? 0 points  What month? 0 points  What time? 0 points  Count back from 20 0 points  Months in reverse 0 points  Repeat phrase 0 points  Total Score 0        Office Visit from 03/30/2019 in [facility] at [facility]  AUDIT-C Score  0       Home Environment:    Live in one story apartment  Watches 16 and 70 year old everyday  No Grab bars No scattered rugs Adequate lighting/no clutter  30 second passed patient can stand.   Patient Active Problem List   Diagnosis Date Noted  . Acute pain of left shoulder 01/12/2018  .  Acute bursitis of left shoulder 01/12/2018  . Vitamin D deficiency 06/02/2015  . Type 2 diabetes mellitus without complication, without long-term current use of insulin (HCC) 06/02/2015  . Osteopenia 06/01/2015  . Hyperlipidemia 06/04/2013  . Tobacco use disorder 06/04/2013     Past Medical History:  Diagnosis Date  . Osteopenia of the elderly 12/16/2013   T score -1.3 at left femur     History reviewed. No pertinent surgical history.   Family History  Problem Relation Age of Onset  . Breast cancer Neg Hx      Social History   Socioeconomic History  . Marital status: Single    Spouse name: Not on file  . Number of children: Not on file  . Years of education: Not on file  . Highest education level: Not on file  Occupational History  . Not on file  Social Needs  . Financial resource strain: Not on file  . Food insecurity    Worry: Not on file    Inability: Not on file  . Transportation needs    Medical: Not on file    Non-medical: Not on file  Tobacco Use  . Smoking status: Current Every Day Smoker    Packs/day: 0.60    Years: 56.00    Pack years: 33.60  . Smokeless tobacco: Never Used  Substance and Sexual Activity  . Alcohol use: No  . Drug use: No  . Sexual activity: Not on file  Lifestyle  . Physical  activity    Days per week: Not on file    Minutes per session: Not on file  . Stress: Not on file  Relationships  . Social Herbalist on phone: Not on file    Gets together: Not on file    Attends religious service: Not on file    Active member of club or organization: Not on file    Attends meetings of clubs or organizations: Not on file    Relationship status: Not on file  . Intimate partner violence    Fear of current or ex partner: Not on file    Emotionally abused: Not on file    Physically abused: Not on file    Forced sexual activity: Not on file  Other Topics Concern  . Not on file  Social History Narrative   Significant other.  Education: [employer].     No Known Allergies   Prior to Admission medications   Medication Sig Start Date End Date Taking? Authorizing Provider  atorvastatin (LIPITOR) 40 MG tablet TAKE 1 TABLET EVERY DAY 02/09/19  Yes Rutherford Guys, MD  Omega-3 Fatty Acids (FISH OIL PO) Take by mouth.   Yes [provider]     Depression screen [facility] 2/9 03/30/2019 09/24/2018 02/11/2018 01/28/2018 01/12/2018  Decreased Interest 0 0 0 0 0  Down, Depressed, Hopeless 0 0 0 0 0  PHQ - 2 Score 0 0 0 0 0     Fall Risk  03/30/2019 09/24/2018 02/11/2018 01/28/2018 01/12/2018  Falls in the past year? 0 0 No No No  Number falls in past yr: 0 0 - - -  Injury with Fall? 0 0 - - -  Follow up Falls evaluation completed;Education provided;Falls prevention discussed - - - -      PHYSICAL EXAM: BP 136/71 (BP Location: Left Arm, Patient Position: Sitting, Cuff Size: Normal)   Pulse 79   Temp 98.6 F (37 C) (Oral)   Resp 16   Ht 4\' 11"  (1.499 m)   Wt 122 lb 3.2 oz (55.4 kg)   SpO2 99%   BMI 24.68 kg/m    Wt Readings from Last 3 Encounters:  03/30/19 122 lb 3.2 oz (55.4 kg)  09/24/18 124 lb (56.2 kg)  02/11/18 128 lb (58.1 kg)     Need for prophylactic vaccination and inoculation against influenza - Plan: Flu vaccine HIGH DOSE PF (Fluzone High dose)  Hyperlipidemia, unspecified hyperlipidemia type - Plan: Lipid panel, Comprehensive metabolic panel  Type 2 diabetes mellitus without complication, without long-term current use of insulin (HCC) - Plan: Hemoglobin A1c    Physical Exam   Education/Counseling provided regarding diet and exercise, prevention of chronic diseases, smoking/tobacco cessation, if applicable, and reviewed "Covered Medicare Preventive Services."   ASSESSMENT/PLAN: 1. Need for prophylactic vaccination and inoculation against influenza - Flu vaccine HIGH DOSE PF (Fluzone High dose)  2. Hyperlipidemia, unspecified hyperlipidemia type - Lipid panel - Comprehensive  metabolic panel  3. Type 2 diabetes mellitus without complication, without long-term current use of insulin (HCC) - Hemoglobin A1c

## 2019-03-31 LAB — COMPREHENSIVE METABOLIC PANEL
ALT: 14 IU/L (ref 0–32)
AST: 16 IU/L (ref 0–40)
Albumin/Globulin Ratio: 1.8 (ref 1.2–2.2)
Albumin: 4.8 g/dL (ref 3.8–4.8)
Alkaline Phosphatase: 98 IU/L (ref 39–117)
BUN/Creatinine Ratio: 9 — ABNORMAL LOW (ref 12–28)
BUN: 10 mg/dL (ref 8–27)
Bilirubin Total: 0.3 mg/dL (ref 0.0–1.2)
CO2: 20 mmol/L (ref 20–29)
Calcium: 10.1 mg/dL (ref 8.7–10.3)
Chloride: 107 mmol/L — ABNORMAL HIGH (ref 96–106)
Creatinine, Ser: 1.11 mg/dL — ABNORMAL HIGH (ref 0.57–1.00)
GFR calc Af Amer: 58 mL/min/{1.73_m2} — ABNORMAL LOW (ref >59)
GFR calc non Af Amer: 50 mL/min/{1.73_m2} — ABNORMAL LOW (ref >59)
Globulin, Total: 2.7 g/dL (ref 1.5–4.5)
Glucose: 87 mg/dL (ref 65–99)
Potassium: 4.3 mmol/L (ref 3.5–5.2)
Sodium: 140 mmol/L (ref 134–144)
Total Protein: 7.5 g/dL (ref 6.0–8.5)

## 2019-03-31 LAB — LIPID PANEL
Chol/HDL Ratio: 3.6 ratio (ref 0.0–4.4)
Cholesterol, Total: 173 mg/dL (ref 100–199)
HDL: 48 mg/dL (ref >39)
LDL Chol Calc (NIH): 106 mg/dL — ABNORMAL HIGH (ref 0–99)
Triglycerides: 104 mg/dL (ref 0–149)
VLDL Cholesterol Cal: 19 mg/dL (ref 5–40)

## 2019-03-31 LAB — HEMOGLOBIN A1C
Est. average glucose Bld gHb Est-mCnc: 140 mg/dL
Hgb A1c MFr Bld: 6.5 % — ABNORMAL HIGH (ref 4.8–5.6)

## 2019-04-11 ENCOUNTER — Encounter: Payer: Self-pay | Admitting: Radiology

## 2019-04-14 ENCOUNTER — Ambulatory Visit: Payer: Medicare HMO | Admitting: Family Medicine

## 2019-04-18 ENCOUNTER — Ambulatory Visit (INDEPENDENT_AMBULATORY_CARE_PROVIDER_SITE_OTHER): Payer: Medicare HMO | Admitting: Family Medicine

## 2019-04-18 ENCOUNTER — Other Ambulatory Visit: Payer: Self-pay

## 2019-04-18 ENCOUNTER — Encounter: Payer: Self-pay | Admitting: Family Medicine

## 2019-04-18 VITALS — BP 120/72 | HR 74 | Temp 98.6°F | Ht 59.0 in | Wt 121.4 lb

## 2019-04-18 DIAGNOSIS — I739 Peripheral vascular disease, unspecified: Secondary | ICD-10-CM | POA: Diagnosis not present

## 2019-04-18 DIAGNOSIS — N289 Disorder of kidney and ureter, unspecified: Secondary | ICD-10-CM

## 2019-04-18 DIAGNOSIS — R5383 Other fatigue: Secondary | ICD-10-CM | POA: Diagnosis not present

## 2019-04-18 DIAGNOSIS — R0989 Other specified symptoms and signs involving the circulatory and respiratory systems: Secondary | ICD-10-CM | POA: Diagnosis not present

## 2019-04-18 DIAGNOSIS — E559 Vitamin D deficiency, unspecified: Secondary | ICD-10-CM | POA: Diagnosis not present

## 2019-04-18 MED ORDER — ATORVASTATIN CALCIUM 80 MG PO TABS: 80.0000 mg | ORAL_TABLET | Freq: Every day | ORAL | 1 refills | Status: DC

## 2019-04-18 NOTE — Patient Instructions (Signed)
° ° ° °  If you have lab work done today you will be contacted with your lab results within the next 2 weeks.  If you have not heard from us then please contact us. The fastest way to get your results is to register for My Chart. ° ° °IF you received an x-ray today, you will receive an invoice from [facility]. Please contact [facility] at [phone] with questions or concerns regarding your invoice.  ° °IF you received labwork today, you will receive an invoice from [facility]. Please contact [facility] at [phone] with questions or concerns regarding your invoice.  ° °Our billing staff will not be able to assist you with questions regarding bills from these companies. ° °You will be contacted with the lab results as soon as they are available. The fastest way to get your results is to activate your My Chart account. Instructions are located on the last page of this paperwork. If you have not heard from us regarding the results in 2 weeks, please contact this office. °  ° ° ° °

## 2019-04-18 NOTE — Progress Notes (Signed)
10/12/202011:34 AM  Sharon Peterson Feb 22, 1951, 68 y.o., female [id number]  Chief Complaint  Patient presents with  . Hypertension    2 wk f/u from [facility], pt says she has been drinking plenty of water    HPI:   Patient is a 68 y.o. female with past medical history significant for DM2, HLP, tob use who presents today for   Last OV March 2020 - no changes Waking up tired, not snoring Drinks plenty of water Has never been told she has high BP Caring for her 2 yo grandson  Main concern is calf pain, R > L, with walking After about walking for 15 mins she feels calves get tight, tired, painful Resting resolves Has been taking ibuprofen for the pain Denies any cramping Cont to smoke, not interested in quitting   Lab Results  Component Value Date   HGBA1C 6.5 (H) 03/30/2019   HGBA1C 6.8 (A) 09/24/2018   HGBA1C 6.4 05/14/2017   Lab Results  Component Value Date   MICROALBUR 0.7 11/29/2015   LDLCALC 106 (H) 03/30/2019   CREATININE 1.11 (H) 03/30/2019    Depression screen PHQ 2/9 04/18/2019 03/30/2019 09/24/2018  Decreased Interest 0 0 0  Down, Depressed, Hopeless 0 0 0  PHQ - 2 Score 0 0 0    Fall Risk  04/18/2019 03/30/2019 09/24/2018 02/11/2018 01/28/2018  Falls in the past year? 0 0 0 No No  Number falls in past yr: 0 0 0 - -  Injury with Fall? 0 0 0 - -  Follow up - Falls evaluation completed;Education provided;Falls prevention discussed - - -     No Known Allergies  Prior to Admission medications   Medication Sig Start Date End Date Taking? Authorizing Provider  atorvastatin (LIPITOR) 40 MG tablet TAKE 1 TABLET EVERY DAY 02/09/19  Yes Myles Lipps, MD  Omega-3 Fatty Acids (FISH OIL PO) Take by mouth.   Yes [provider]    Past Medical History:  Diagnosis Date  . Osteopenia of the elderly 12/16/2013   T score -1.3 at left femur    History reviewed. No pertinent surgical history.  Social History   Tobacco Use  . Smoking status: Current  Every Day Smoker    Packs/day: 0.60    Years: 56.00    Pack years: 33.60  . Smokeless tobacco: Never Used  Substance Use Topics  . Alcohol use: No    Family History  Problem Relation Age of Onset  . Breast cancer Neg Hx     Review of Systems  Constitutional: Positive for malaise/fatigue. Negative for chills, fever and weight loss.  Respiratory: Negative for cough and shortness of breath.   Cardiovascular: Positive for claudication. Negative for chest pain, palpitations, orthopnea, leg swelling and PND.  Gastrointestinal: Negative for abdominal pain, blood in stool, constipation, diarrhea, melena, nausea and vomiting.  Genitourinary: Negative for frequency, hematuria and urgency.  Neurological: Negative for dizziness, tingling, focal weakness and headaches.  Endo/Heme/Allergies: Does not bruise/bleed easily.  Psychiatric/Behavioral: Negative for depression. The patient is nervous/anxious. The patient does not have insomnia.    Per hpi  OBJECTIVE:  Today's Vitals   04/18/19 1124 04/18/19 1204  BP: (!) 160/74 120/72  Pulse: 74   Temp: 98.6 F (37 C)   SpO2: 100%   Weight: 121 lb 6.4 oz (55.1 kg)   Height: 4\' 11"  (1.499 m)    Body mass index is 24.52 kg/m.  Physical Exam Vitals signs and nursing note reviewed.  Constitutional:  Appearance: She is well-developed.  HENT:     Head: Normocephalic and atraumatic.     Mouth/Throat:     Pharynx: No oropharyngeal exudate.  Eyes:     General: No scleral icterus.    Conjunctiva/sclera: Conjunctivae normal.     Pupils: Pupils are equal, round, and reactive to light.  Neck:     Musculoskeletal: Neck supple.  Cardiovascular:     Rate and Rhythm: Normal rate and regular rhythm.     Pulses:          Dorsalis pedis pulses are 0 on the right side and 2+ on the left side.       Posterior tibial pulses are 0 on the right side and 0 on the left side.     Heart sounds: Normal heart sounds. No murmur. No friction rub. No gallop.    Pulmonary:     Effort: Pulmonary effort is normal.     Breath sounds: Normal breath sounds. No wheezing or rales.  Musculoskeletal:     Right lower leg: No edema.     Left lower leg: No edema.  Skin:    General: Skin is warm and dry.  Neurological:     Mental Status: She is alert and oriented to person, place, and time.     No results found for this or any previous visit (from the past 24 hour(s)).  No results found.   ASSESSMENT and PLAN  1. Decreased pedal pulses 2. Claudication of calf muscles (HCC) - VAS [place] ABI WITH/WO TBI; Future  3. Function kidney decreased Repeat labs pending. Might be related to recent nsaids use.  - Basic Metabolic Panel - Care order/instruction:  4. Fatigue, unspecified type Discussed recent childcare responsibilities. Labs pending.  - CBC - TSH  5. Vitamin D deficiency - Vitamin D, 25-hydroxy  Other orders LDL above > 100. Increasing statin - atorvastatin (LIPITOR) 80 MG tablet; Take 1 tablet (80 mg total) by mouth daily.  Return in about 6 months (around 10/17/2019).    Rutherford Guys, MD Primary Care at [facility] [place], [place] [address] Ph.  [phone] Fax [phone]

## 2019-04-19 LAB — BASIC METABOLIC PANEL
BUN/Creatinine Ratio: 13 (ref 12–28)
BUN: 14 mg/dL (ref 8–27)
CO2: 19 mmol/L — ABNORMAL LOW (ref 20–29)
Calcium: 10.1 mg/dL (ref 8.7–10.3)
Chloride: 108 mmol/L — ABNORMAL HIGH (ref 96–106)
Creatinine, Ser: 1.05 mg/dL — ABNORMAL HIGH (ref 0.57–1.00)
GFR calc Af Amer: 62 mL/min/{1.73_m2} (ref >59)
GFR calc non Af Amer: 54 mL/min/{1.73_m2} — ABNORMAL LOW (ref >59)
Glucose: 104 mg/dL — ABNORMAL HIGH (ref 65–99)
Potassium: 4 mmol/L (ref 3.5–5.2)
Sodium: 141 mmol/L (ref 134–144)

## 2019-04-19 LAB — CBC
Hematocrit: 36.8 % (ref 34.0–46.6)
Hemoglobin: 12 g/dL (ref 11.1–15.9)
MCH: 26.1 pg — ABNORMAL LOW (ref 26.6–33.0)
MCHC: 32.6 g/dL (ref 31.5–35.7)
MCV: 80 fL (ref 79–97)
Platelets: 251 10*3/uL (ref 150–450)
RBC: 4.6 x10E6/uL (ref 3.77–5.28)
RDW: 14.8 % (ref 11.7–15.4)
WBC: 7.6 10*3/uL (ref 3.4–10.8)

## 2019-04-19 LAB — TSH: TSH: 1.47 u[IU]/mL (ref 0.450–4.500)

## 2019-04-19 LAB — VITAMIN D 25 HYDROXY (VIT D DEFICIENCY, FRACTURES): Vit D, 25-Hydroxy: 20 ng/mL — ABNORMAL LOW (ref 30.0–100.0)

## 2019-04-25 ENCOUNTER — Other Ambulatory Visit (HOSPITAL_COMMUNITY): Payer: Self-pay | Admitting: Family Medicine

## 2019-04-25 DIAGNOSIS — I739 Peripheral vascular disease, unspecified: Secondary | ICD-10-CM

## 2019-04-26 MED ORDER — VITAMIN D (ERGOCALCIFEROL) 1.25 MG (50000 UNIT) PO CAPS: 50000.0000 [IU] | ORAL_CAPSULE | ORAL | 0 refills | Status: DC

## 2019-04-26 NOTE — Addendum Note (Signed)
Addended by: Rutherford Guys on: 04/26/2019 10:57 AM   Modules accepted: Orders

## 2019-04-29 ENCOUNTER — Ambulatory Visit (HOSPITAL_COMMUNITY)
Admission: RE | Admit: 2019-04-29 | Discharge: 2019-04-29 | Disposition: A | Discharge status: Home / Self Care | Payer: Medicare HMO | Source: Ambulatory Visit | Attending: Cardiovascular Disease | Admitting: Cardiovascular Disease

## 2019-04-29 ENCOUNTER — Other Ambulatory Visit: Payer: Self-pay | Admitting: Family Medicine

## 2019-04-29 ENCOUNTER — Other Ambulatory Visit: Payer: Self-pay

## 2019-04-29 DIAGNOSIS — I739 Peripheral vascular disease, unspecified: Secondary | ICD-10-CM

## 2019-04-29 DIAGNOSIS — R0989 Other specified symptoms and signs involving the circulatory and respiratory systems: Secondary | ICD-10-CM

## 2019-04-29 DIAGNOSIS — R6889 Other general symptoms and signs: Secondary | ICD-10-CM

## 2019-05-03 ENCOUNTER — Telehealth: Payer: Self-pay | Admitting: Family Medicine

## 2019-05-03 NOTE — Telephone Encounter (Signed)
°  She  missed a call

## 2019-05-03 NOTE — Telephone Encounter (Signed)
Copied from [place] [id number]. Topic: General - Other >> May 03, 2019  1:19 PM Mcneil, Ja-Kwan wrote: Reason for CRM: Pt called to request lab results. Pt requests call back

## 2019-05-03 NOTE — Telephone Encounter (Deleted)
Pt needs accomodation forms filled by the provider, states she will drop those forms off

## 2019-05-05 ENCOUNTER — Telehealth: Payer: Self-pay | Admitting: Family Medicine

## 2019-05-05 NOTE — Telephone Encounter (Signed)
Copied from [place] [phone]. Topic: General - Inquiry >> May 04, 2019  4:10 PM Sharon Peterson, [place] wrote: Reason for CRM: Patient calling and would like a call to go over her results from her leg ultrasounds. Please advise. Patient is very anxious.

## 2019-05-11 NOTE — Telephone Encounter (Signed)
Informed pt of labs, she verbalized understanding.  

## 2019-05-11 NOTE — Telephone Encounter (Signed)
Informed pt of labs she verbalized understanding, she also was concerned about her [place] of her leg. She states at her last OV she was sent for the [place] and would like to discuss the results.

## 2019-05-12 NOTE — Telephone Encounter (Signed)
Patient has been called multiple times with no answer, including by me today I did leave VM stating that results were posted on mychart and that she should except a phone from vascular surgery to make appointment I also advised her to make an appointment if she felt that results needed to be further discussed.

## 2019-05-17 ENCOUNTER — Ambulatory Visit: Payer: Medicare HMO | Admitting: Family Medicine

## 2019-05-17 ENCOUNTER — Telehealth: Payer: Self-pay | Admitting: Family Medicine

## 2019-05-17 NOTE — Telephone Encounter (Signed)
I called pt and let her know that I cancelled her appointment with Dr. Pamella Pert. She called out today. I left a vm for her to call and reschedule her appointment.

## 2019-05-20 ENCOUNTER — Ambulatory Visit (INDEPENDENT_AMBULATORY_CARE_PROVIDER_SITE_OTHER): Payer: Medicare HMO | Admitting: Family Medicine

## 2019-05-20 ENCOUNTER — Other Ambulatory Visit: Payer: Self-pay

## 2019-05-20 ENCOUNTER — Encounter: Payer: Self-pay | Admitting: Family Medicine

## 2019-05-20 VITALS — BP 201/63 | HR 72 | Temp 98.9°F | Ht 59.0 in | Wt 123.0 lb

## 2019-05-20 DIAGNOSIS — F17200 Nicotine dependence, unspecified, uncomplicated: Secondary | ICD-10-CM | POA: Diagnosis not present

## 2019-05-20 DIAGNOSIS — I739 Peripheral vascular disease, unspecified: Secondary | ICD-10-CM | POA: Diagnosis not present

## 2019-05-20 DIAGNOSIS — E785 Hyperlipidemia, unspecified: Secondary | ICD-10-CM

## 2019-05-20 DIAGNOSIS — R030 Elevated blood-pressure reading, without diagnosis of hypertension: Secondary | ICD-10-CM

## 2019-05-20 MED ORDER — ROSUVASTATIN CALCIUM 20 MG PO TABS: 20.0000 mg | ORAL_TABLET | Freq: Every day | ORAL | 1 refills | Status: DC

## 2019-05-20 MED ORDER — AMLODIPINE BESYLATE 5 MG PO TABS: 5.0000 mg | ORAL_TABLET | Freq: Every day | ORAL | 1 refills | Status: DC

## 2019-05-20 NOTE — Patient Instructions (Addendum)
  [facility] [place], [place], [place] [address] Phone: [phone]   If you have lab work done today you will be contacted with your lab results within the next 2 weeks.  If you have not heard from [place] then please contact us. The fastest way to get your results is to register for My Chart.   IF you received an x-ray today, you will receive an invoice from [facility]. Please contact [facility] at [phone] with questions or concerns regarding your invoice.   IF you received labwork today, you will receive an invoice from [facility]. Please contact [facility] at [phone] with questions or concerns regarding your invoice.   Our billing staff will not be able to assist you with questions regarding bills from these companies.  You will be contacted with the lab results as soon as they are available. The fastest way to get your results is to activate your My Chart account. Instructions are located on the last page of this paperwork. If you have not heard from [place] regarding the results in 2 weeks, please contact this office.

## 2019-05-20 NOTE — Progress Notes (Signed)
11/13/20205:11 PM  Sharon Peterson March 19, 1951, 68 y.o., female [id number]  Chief Complaint  Patient presents with  . Follow-up    pain in  the right leg, hurts to walk    HPI:   Patient is a 68 y.o. female with past medical history significant for DM2, HLP, tob use who presents today for followup on ABI/areterial duplex  RLE claudication ABI abnormal on the right Duplex Summary: Right: 50-74% stenosis noted in the superficial femoral artery. Segment in the mid to distal PTA that is occluded. Distal portion of the peroneal is occluded. Left: 30-49% stenosis noted in the iliac segment. There is a possible stenosis in the proximal segment of PTA and peroneal artery Referral made to vasc surg, LVM to call for appt on 05/16/2019  Lab Results  Component Value Date   CHOL 173 03/30/2019   HDL 48 03/30/2019   LDLCALC 106 (H) 03/30/2019   LDLDIRECT 213 (H) 05/27/2013   TRIG 104 03/30/2019   CHOLHDL 3.6 03/30/2019   Depression screen PHQ 2/9 05/20/2019 04/18/2019 03/30/2019  Decreased Interest 0 0 0  Down, Depressed, Hopeless 0 0 0  PHQ - 2 Score 0 0 0    Fall Risk  05/20/2019 04/18/2019 03/30/2019 09/24/2018 02/11/2018  Falls in the past year? 0 0 0 0 No  Number falls in past yr: 0 0 0 0 -  Injury with Fall? 0 0 0 0 -  Follow up - - Falls evaluation completed;Education provided;Falls prevention discussed - -     No Known Allergies  Prior to Admission medications   Medication Sig Start Date End Date Taking? Authorizing Provider  atorvastatin (LIPITOR) 80 MG tablet Take 1 tablet (80 mg total) by mouth daily. 04/18/19  Yes Myles Lipps, MD  Omega-3 Fatty Acids (FISH OIL PO) Take by mouth.   Yes [provider]  Vitamin D, Ergocalciferol, (DRISDOL) 1.25 MG (50000 UT) CAPS capsule Take 1 capsule (50,000 Units total) by mouth every 7 (seven) days. 04/26/19  Yes Myles Lipps, MD    Past Medical History:  Diagnosis Date  . Osteopenia of the elderly 12/16/2013    T score -1.3 at left femur    No past surgical history on file.  Social History   Tobacco Use  . Smoking status: Current Every Day Smoker    Packs/day: 0.60    Years: 56.00    Pack years: 33.60  . Smokeless tobacco: Never Used  Substance Use Topics  . Alcohol use: No    Family History  Problem Relation Age of Onset  . Breast cancer Neg Hx     Review of Systems  Constitutional: Negative for chills and fever.  Eyes: Negative for blurred vision and double vision.  Respiratory: Negative for cough and shortness of breath.   Cardiovascular: Positive for claudication. Negative for chest pain, palpitations and leg swelling.  Gastrointestinal: Negative for abdominal pain, nausea and vomiting.  Neurological: Negative for dizziness, sensory change, speech change, focal weakness and headaches.   Per hpi  OBJECTIVE:  Today's Vitals   05/20/19 1658  BP: (!) 201/63  Pulse: 72  Temp: 98.9 F (37.2 C)  SpO2: 99%  Weight: 123 lb (55.8 kg)  Height: 4\' 11"  (1.499 m)   Body mass index is 24.84 kg/m.  BP Readings from Last 3 Encounters:  05/20/19 (!) 201/63  04/18/19 120/72  03/30/19 136/71    Physical Exam Vitals signs and nursing note reviewed.  Constitutional:      Appearance: She  is well-developed.  HENT:     Head: Normocephalic and atraumatic.     Mouth/Throat:     Pharynx: No oropharyngeal exudate.  Eyes:     General: No scleral icterus.    Conjunctiva/sclera: Conjunctivae normal.     Pupils: Pupils are equal, round, and reactive to light.  Neck:     Musculoskeletal: Neck supple.  Cardiovascular:     Rate and Rhythm: Normal rate and regular rhythm.     Heart sounds: Normal heart sounds. No murmur. No friction rub. No gallop.   Pulmonary:     Effort: Pulmonary effort is normal.     Breath sounds: Normal breath sounds. No wheezing, rhonchi or rales.  Musculoskeletal:     Right lower leg: No edema.     Left lower leg: No edema.  Skin:    General: Skin is  warm and dry.  Neurological:     Mental Status: She is alert and oriented to person, place, and time.     No results found for this or any previous visit (from the past 24 hour(s)).  No results found.   ASSESSMENT and PLAN  1. PAD (peripheral artery disease) ([place]) Advised patient to make appt with vascular surgeon for further eval and treatment. Discussed improved LDL and importance of quitting smoking.   2. Hyperlipidemia, unspecified hyperlipidemia type Uncontrolled,, changed to crestor. Recheck labs ~ 3 months  3. Tobacco use disorder Strongly encouraged patient to quit smoking. Patient precontemplative  4. Elevated BP without diagnosis of hypertension Patient asymptomatic. Starting amlodipine 5mg  daily. Strict ER precautions given.   Other orders - rosuvastatin (CRESTOR) 20 MG tablet; Take 1 tablet (20 mg total) by mouth daily. - amLODipine (NORVASC) 5 MG tablet; Take 1 tablet (5 mg total) by mouth daily.  Return in about 1 week (around 05/27/2019) for BP.    Rutherford Guys, MD Primary Care at [place], [place] [address] Ph.  [phone] Fax [phone]

## 2019-05-25 ENCOUNTER — Other Ambulatory Visit: Payer: Self-pay | Admitting: Family Medicine

## 2019-05-27 ENCOUNTER — Encounter: Payer: Self-pay | Admitting: Family Medicine

## 2019-05-27 ENCOUNTER — Other Ambulatory Visit: Payer: Self-pay

## 2019-05-27 ENCOUNTER — Ambulatory Visit (INDEPENDENT_AMBULATORY_CARE_PROVIDER_SITE_OTHER): Payer: Medicare HMO | Admitting: Family Medicine

## 2019-05-27 VITALS — BP 110/74 | HR 75 | Temp 98.2°F | Ht 59.0 in | Wt 123.0 lb

## 2019-05-27 DIAGNOSIS — R030 Elevated blood-pressure reading, without diagnosis of hypertension: Secondary | ICD-10-CM

## 2019-05-27 NOTE — Patient Instructions (Signed)
° ° ° °  If you have lab work done today you will be contacted with your lab results within the next 2 weeks.  If you have not heard from us then please contact us. The fastest way to get your results is to register for My Chart. ° ° °IF you received an x-ray today, you will receive an invoice from [facility]. Please contact [facility] at [phone] with questions or concerns regarding your invoice.  ° °IF you received labwork today, you will receive an invoice from [facility]. Please contact [facility] at [phone] with questions or concerns regarding your invoice.  ° °Our billing staff will not be able to assist you with questions regarding bills from these companies. ° °You will be contacted with the lab results as soon as they are available. The fastest way to get your results is to activate your My Chart account. Instructions are located on the last page of this paperwork. If you have not heard from us regarding the results in 2 weeks, please contact this office. °  ° ° ° °

## 2019-05-27 NOTE — Progress Notes (Signed)
Nurse visit for BP check

## 2019-06-21 ENCOUNTER — Encounter: Payer: Self-pay | Admitting: Vascular Surgery

## 2019-06-21 ENCOUNTER — Ambulatory Visit (INDEPENDENT_AMBULATORY_CARE_PROVIDER_SITE_OTHER): Payer: Medicare HMO | Admitting: Vascular Surgery

## 2019-06-21 ENCOUNTER — Other Ambulatory Visit: Payer: Self-pay

## 2019-06-21 VITALS — BP 119/69 | HR 72 | Temp 97.7°F | Resp 20 | Ht 59.0 in | Wt 127.0 lb

## 2019-06-21 DIAGNOSIS — I70213 Atherosclerosis of native arteries of extremities with intermittent claudication, bilateral legs: Secondary | ICD-10-CM

## 2019-06-21 NOTE — Progress Notes (Signed)
[facility]  Patient name: Sharon Peterson MRN: [id number] DOB: 1951/04/11 Sex: female  REASON FOR CONSULT: Evaluation bilateral lower extremity arterial insufficiency with intermittent claudication  HPI: Sharon Peterson is a 68 y.o. female, who is here today for discussion of leg symptoms.  She is a very pleasant active 68 year old who has classic bilateral calf claudication.  She reports no resting symptoms.  She reports that if she walks any distance she has severe discomfort in both calves making her stop.  She has minimal other health issues and this is quite limiting to her.  She denies any history of tissue loss.  She denies any history of cardiac difficulty or stroke  Past Medical History:  Diagnosis Date  . Hyperlipidemia   . Hypertension   . Osteopenia of the elderly 12/16/2013   T score -1.3 at left femur    Family History  Problem Relation Age of Onset  . Breast cancer Neg Hx     SOCIAL HISTORY: Social History   Socioeconomic History  . Marital status: Single    Spouse name: Not on file  . Number of children: Not on file  . Years of education: Not on file  . Highest education level: Not on file  Occupational History  . Not on file  Tobacco Use  . Smoking status: Current Every Day Smoker    Packs/day: 0.50    Years: 56.00    Pack years: 28.00    Types: Cigarettes  . Smokeless tobacco: Never Used  Substance and Sexual Activity  . Alcohol use: No  . Drug use: No  . Sexual activity: Not Currently  Other Topics Concern  . Not on file  Social History Narrative   Significant other. Education: [employer].   Social Determinants of Health   Financial Resource Strain:   . Difficulty of Paying Living Expenses: Not on file  Food Insecurity:   . Worried About Programme researcher, broadcasting/film/video in the Last Year: Not on file  . Ran Out of Food in the Last Year: Not on file  Transportation Needs:   . Lack of  Transportation (Medical): Not on file  . Lack of Transportation (Non-Medical): Not on file  Physical Activity:   . Days of Exercise per Week: Not on file  . Minutes of Exercise per Session: Not on file  Stress:   . Feeling of Stress : Not on file  Social Connections:   . Frequency of Communication with Friends and Family: Not on file  . Frequency of Social Gatherings with Friends and Family: Not on file  . Attends Religious Services: Not on file  . Active Member of Clubs or Organizations: Not on file  . Attends Banker Meetings: Not on file  . Marital Status: Not on file  Intimate Partner Violence:   . Fear of Current or Ex-Partner: Not on file  . Emotionally Abused: Not on file  . Physically Abused: Not on file  . Sexually Abused: Not on file    No Known Allergies  Current Outpatient Medications  Medication Sig Dispense Refill  . amLODipine (NORVASC) 5 MG tablet Take 1 tablet (5 mg total) by mouth daily. 30 tablet 1  . Omega-3 Fatty Acids (FISH OIL PO) Take by mouth.    . rosuvastatin (CRESTOR) 20 MG tablet Take 1 tablet (20 mg total) by mouth daily. 90 tablet 1  . Vitamin D, Ergocalciferol, (DRISDOL) 1.25 MG (50000 UT) CAPS capsule Take 1  capsule (50,000 Units total) by mouth every 7 (seven) days. 12 capsule 0   No current facility-administered medications for this visit.    REVIEW OF SYSTEMS:  [X]  denotes positive finding, [ ]  denotes negative finding Cardiac  Comments:  Chest pain or chest pressure:    Shortness of breath upon exertion:    Short of breath when lying flat:    Irregular heart rhythm:        Vascular    Pain in calf, thigh, or hip brought on by ambulation: x   Pain in feet at night that wakes you up from your sleep:     Blood clot in your veins:    Leg swelling:         Pulmonary    Oxygen at home:    Productive cough:     Wheezing:         Neurologic    Sudden weakness in arms or legs:     Sudden numbness in arms or legs:       Sudden onset of difficulty speaking or slurred speech:    Temporary loss of vision in one eye:     Problems with dizziness:         Gastrointestinal    Blood in stool:     Vomited blood:         Genitourinary    Burning when urinating:     Blood in urine:        Psychiatric    Major depression:         Hematologic    Bleeding problems:    Problems with blood clotting too easily:        Skin    Rashes or ulcers:        Constitutional    Fever or chills:      PHYSICAL EXAM: Vitals:   06/21/19 1247  BP: 119/69  Pulse: 72  Resp: 20  Temp: 97.7 F (36.5 C)  SpO2: 99%  Weight: 127 lb (57.6 kg)  Height: 4\' 11"  (1.499 m)    GENERAL: The patient is a well-nourished female, in no acute distress. The vital signs are documented above. CARDIOVASCULAR: Carotid arteries without bruits bilaterally.  2+ radial pulses bilaterally.  Palpable femoral and 1+ distal pulses in the dorsalis pedis bilaterally. PULMONARY: There is good air exchange  ABDOMEN: Soft and non-tender  MUSCULOSKELETAL: There are no major deformities or cyanosis. NEUROLOGIC: No focal weakness or paresthesias are detected. SKIN: There are no ulcers or rashes noted. PSYCHIATRIC: The patient has a normal affect.  DATA:  Noninvasive studies reveal normal ankle arm index bilaterally but monophasic waveforms bilaterally suggesting proximal disease  MEDICAL ISSUES: Had a long discussion with the patient regarding this.  I do feel that she has clear-cut intermittent claudication related to arterial insufficiency.  I explained that this is not a dangerous situation and at her level is not limb threatening.  I explained the next evaluation would be arteriography.  She reports that she is able to take and tolerate this level of disability and I have recommended that she continue observation only.  She will continue to monitor her symptoms and if it becomes more limiting to her she will notify us and we will proceed with  arteriography and intervention.   Rosetta Posner, MD FACS [facility] Tel [phone] Pager [phone]

## 2019-07-14 ENCOUNTER — Other Ambulatory Visit: Payer: Self-pay | Admitting: Family Medicine

## 2019-08-19 ENCOUNTER — Ambulatory Visit: Payer: Medicare HMO | Admitting: Family Medicine

## 2019-09-07 ENCOUNTER — Other Ambulatory Visit: Payer: Self-pay | Admitting: Family Medicine

## 2019-09-26 ENCOUNTER — Telehealth: Payer: Self-pay | Admitting: Family Medicine

## 2019-10-05 ENCOUNTER — Other Ambulatory Visit: Payer: Self-pay | Admitting: Family Medicine

## 2019-10-17 ENCOUNTER — Encounter: Payer: Self-pay | Admitting: Registered Nurse

## 2019-10-17 ENCOUNTER — Other Ambulatory Visit: Payer: Self-pay

## 2019-10-17 ENCOUNTER — Ambulatory Visit (INDEPENDENT_AMBULATORY_CARE_PROVIDER_SITE_OTHER): Payer: Medicare HMO | Admitting: Registered Nurse

## 2019-10-17 ENCOUNTER — Ambulatory Visit: Payer: Medicare HMO | Admitting: Family Medicine

## 2019-10-17 VITALS — BP 133/67 | HR 70 | Temp 97.6°F | Resp 18 | Ht 59.0 in | Wt 124.4 lb

## 2019-10-17 DIAGNOSIS — Z1329 Encounter for screening for other suspected endocrine disorder: Secondary | ICD-10-CM

## 2019-10-17 DIAGNOSIS — Z13220 Encounter for screening for lipoid disorders: Secondary | ICD-10-CM | POA: Diagnosis not present

## 2019-10-17 DIAGNOSIS — Z0000 Encounter for general adult medical examination without abnormal findings: Secondary | ICD-10-CM | POA: Diagnosis not present

## 2019-10-17 DIAGNOSIS — I10 Essential (primary) hypertension: Secondary | ICD-10-CM

## 2019-10-17 DIAGNOSIS — Z13228 Encounter for screening for other metabolic disorders: Secondary | ICD-10-CM | POA: Diagnosis not present

## 2019-10-17 DIAGNOSIS — Z130 Encounter for screening for diseases of the blood and blood-forming organs and certain disorders involving the immune mechanism: Secondary | ICD-10-CM

## 2019-10-17 NOTE — Patient Instructions (Signed)
° ° ° °  If you have lab work done today you will be contacted with your lab results within the next 2 weeks.  If you have not heard from us then please contact us. The fastest way to get your results is to register for My Chart. ° ° °IF you received an x-ray today, you will receive an invoice from [facility]. Please contact [facility] at [phone] with questions or concerns regarding your invoice.  ° °IF you received labwork today, you will receive an invoice from [facility]. Please contact [facility] at [phone] with questions or concerns regarding your invoice.  ° °Our billing staff will not be able to assist you with questions regarding bills from these companies. ° °You will be contacted with the lab results as soon as they are available. The fastest way to get your results is to activate your My Chart account. Instructions are located on the last page of this paperwork. If you have not heard from us regarding the results in 2 weeks, please contact this office. °  ° ° ° °

## 2019-10-17 NOTE — Progress Notes (Signed)
Established Patient Office Visit  Subjective:  Patient ID: Sharon Peterson, female    DOB: 01/10/1951  Age: 69 y.o. MRN: [id number]  CC:  Chief Complaint  Patient presents with  . Follow-up    6 month follow up for hypertension    HPI Sharon Peterson presents for 6 mo follow up on htn  Feels well overall, tolerating amlodipine well Does not take home bp No headache, chest pain, shob, doe, dependent edema, claudication, visual changes. No further concerns at this time, feeling very well   Past Medical History:  Diagnosis Date  . Hyperlipidemia   . Hypertension   . Osteopenia of the elderly 12/16/2013   T score -1.3 at left femur    No past surgical history on file.  Family History  Problem Relation Age of Onset  . Breast cancer Neg Hx     Social History   Socioeconomic History  . Marital status: Single    Spouse name: Not on file  . Number of children: Not on file  . Years of education: Not on file  . Highest education level: Not on file  Occupational History  . Not on file  Tobacco Use  . Smoking status: Current Every Day Smoker    Packs/day: 0.50    Years: 56.00    Pack years: 28.00    Types: Cigarettes  . Smokeless tobacco: Never Used  Substance and Sexual Activity  . Alcohol use: No  . Drug use: No  . Sexual activity: Not Currently  Other Topics Concern  . Not on file  Social History Narrative   Significant other. Education: [employer].   Social Determinants of Health   Financial Resource Strain:   . Difficulty of Paying Living Expenses:   Food Insecurity:   . Worried About Charity fundraiser in the Last Year:   . Arboriculturist in the Last Year:   Transportation Needs:   . Film/video editor (Medical):   Marland Kitchen Lack of Transportation (Non-Medical):   Physical Activity:   . Days of Exercise per Week:   . Minutes of Exercise per Session:   Stress:   . Feeling of Stress :   Social Connections:   . Frequency of Communication with  Friends and Family:   . Frequency of Social Gatherings with Friends and Family:   . Attends Religious Services:   . Active Member of Clubs or Organizations:   . Attends Archivist Meetings:   Marland Kitchen Marital Status:   Intimate Partner Violence:   . Fear of Current or Ex-Partner:   . Emotionally Abused:   Marland Kitchen Physically Abused:   . Sexually Abused:     Outpatient Medications Prior to Visit  Medication Sig Dispense Refill  . amLODipine (NORVASC) 5 MG tablet TAKE 1 TABLET(5 MG) BY MOUTH DAILY 30 tablet 1  . Omega-3 Fatty Acids (FISH OIL PO) Take by mouth.    . rosuvastatin (CRESTOR) 20 MG tablet Take 1 tablet (20 mg total) by mouth daily. 90 tablet 1  . Vitamin D, Ergocalciferol, (DRISDOL) 1.25 MG (50000 UT) CAPS capsule Take 1 capsule (50,000 Units total) by mouth every 7 (seven) days. (Patient not taking: Reported on 10/17/2019) 12 capsule 0   No facility-administered medications prior to visit.    No Known Allergies  ROS Review of Systems  Constitutional: Negative.   HENT: Negative.   Eyes: Negative.   Respiratory: Negative.   Cardiovascular: Negative.   Gastrointestinal: Negative.   Endocrine:  Negative.   Genitourinary: Negative.   Musculoskeletal: Negative.   Skin: Negative.   Allergic/Immunologic: Negative.   Neurological: Negative.   Hematological: Negative.   Psychiatric/Behavioral: Negative.   All other systems reviewed and are negative.     Objective:    Physical Exam  Constitutional: She is oriented to person, place, and time.  Cardiovascular: Normal rate, regular rhythm and normal heart sounds. Exam reveals no gallop and no friction rub.  No murmur heard. Pulmonary/Chest: Effort normal and breath sounds normal. No respiratory distress. She has no wheezes. She has no rales. She exhibits no tenderness.  Musculoskeletal:        General: Normal range of motion.  Neurological: She is alert and oriented to person, place, and time. No cranial nerve deficit.  Coordination normal.  Skin: Skin is warm and dry. No rash noted. No erythema. No pallor.  Psychiatric: She has a normal mood and affect. Her behavior is normal. Judgment and thought content normal.  Nursing note and vitals reviewed.   BP 133/67   Pulse 70   Temp 97.6 F (36.4 C) (Temporal)   Resp 18   Ht 4\' 11"  (1.499 m)   Wt 124 lb 6.4 oz (56.4 kg)   SpO2 97%   BMI 25.13 kg/m  Wt Readings from Last 3 Encounters:  10/17/19 124 lb 6.4 oz (56.4 kg)  06/21/19 127 lb (57.6 kg)  05/27/19 123 lb (55.8 kg)     Health Maintenance Due  Topic Date Due  . FOOT EXAM  09/24/2019  . URINE MICROALBUMIN  09/24/2019  . HEMOGLOBIN A1C  09/27/2019    There are no preventive care reminders to display for this patient.  Lab Results  Component Value Date   TSH 1.470 04/18/2019   Lab Results  Component Value Date   WBC 7.6 04/18/2019   HGB 12.0 04/18/2019   HCT 36.8 04/18/2019   MCV 80 04/18/2019   PLT 251 04/18/2019   Lab Results  Component Value Date   NA 141 04/18/2019   K 4.0 04/18/2019   CO2 19 (L) 04/18/2019   GLUCOSE 104 (H) 04/18/2019   BUN 14 04/18/2019   CREATININE 1.05 (H) 04/18/2019   BILITOT 0.3 03/30/2019   ALKPHOS 98 03/30/2019   AST 16 03/30/2019   ALT 14 03/30/2019   PROT 7.5 03/30/2019   ALBUMIN 4.8 03/30/2019   CALCIUM 10.1 04/18/2019   Lab Results  Component Value Date   CHOL 173 03/30/2019   Lab Results  Component Value Date   HDL 48 03/30/2019   Lab Results  Component Value Date   LDLCALC 106 (H) 03/30/2019   Lab Results  Component Value Date   TRIG 104 03/30/2019   Lab Results  Component Value Date   CHOLHDL 3.6 03/30/2019   Lab Results  Component Value Date   HGBA1C 6.5 (H) 03/30/2019      Assessment & Plan:   Problem List Items Addressed This Visit    None    Visit Diagnoses    Routine general medical examination at a health care facility    -  Primary   Relevant Orders   Lipid panel   Hemoglobin A1c   Comprehensive  metabolic panel   CBC with Differential   TSH      No orders of the defined types were placed in this encounter.   Follow-up: No follow-ups on file.   PLAN  bp wnl today  Pt feeling well  Labs collected, will follow up as warranted  Patient encouraged to call clinic with any questions, comments, or concerns.  Maximiano Coss, NP

## 2019-10-18 LAB — CBC WITH DIFFERENTIAL/PLATELET
Basophils Absolute: 0 10*3/uL (ref 0.0–0.2)
Basos: 0 %
EOS (ABSOLUTE): 0.1 10*3/uL (ref 0.0–0.4)
Eos: 1 %
Hematocrit: 34.2 % (ref 34.0–46.6)
Hemoglobin: 11.1 g/dL (ref 11.1–15.9)
Immature Grans (Abs): 0 10*3/uL (ref 0.0–0.1)
Immature Granulocytes: 0 %
Lymphocytes Absolute: 2.7 10*3/uL (ref 0.7–3.1)
Lymphs: 34 %
MCH: 26.2 pg — ABNORMAL LOW (ref 26.6–33.0)
MCHC: 32.5 g/dL (ref 31.5–35.7)
MCV: 81 fL (ref 79–97)
Monocytes Absolute: 0.5 10*3/uL (ref 0.1–0.9)
Monocytes: 6 %
Neutrophils Absolute: 4.5 10*3/uL (ref 1.4–7.0)
Neutrophils: 59 %
Platelets: 263 10*3/uL (ref 150–450)
RBC: 4.23 x10E6/uL (ref 3.77–5.28)
RDW: 15.6 % — ABNORMAL HIGH (ref 11.7–15.4)
WBC: 7.8 10*3/uL (ref 3.4–10.8)

## 2019-10-18 LAB — COMPREHENSIVE METABOLIC PANEL
ALT: 15 IU/L (ref 0–32)
AST: 21 IU/L (ref 0–40)
Albumin/Globulin Ratio: 1.7 (ref 1.2–2.2)
Albumin: 4.3 g/dL (ref 3.8–4.8)
Alkaline Phosphatase: 100 IU/L (ref 39–117)
BUN/Creatinine Ratio: 13 (ref 12–28)
BUN: 17 mg/dL (ref 8–27)
Bilirubin Total: 0.2 mg/dL (ref 0.0–1.2)
CO2: 20 mmol/L (ref 20–29)
Calcium: 10 mg/dL (ref 8.7–10.3)
Chloride: 105 mmol/L (ref 96–106)
Creatinine, Ser: 1.33 mg/dL — ABNORMAL HIGH (ref 0.57–1.00)
GFR calc Af Amer: 47 mL/min/{1.73_m2} — ABNORMAL LOW (ref >59)
GFR calc non Af Amer: 41 mL/min/{1.73_m2} — ABNORMAL LOW (ref >59)
Globulin, Total: 2.6 g/dL (ref 1.5–4.5)
Glucose: 135 mg/dL — ABNORMAL HIGH (ref 65–99)
Potassium: 3.7 mmol/L (ref 3.5–5.2)
Sodium: 141 mmol/L (ref 134–144)
Total Protein: 6.9 g/dL (ref 6.0–8.5)

## 2019-10-18 LAB — LIPID PANEL
Chol/HDL Ratio: 3.1 ratio (ref 0.0–4.4)
Cholesterol, Total: 136 mg/dL (ref 100–199)
HDL: 44 mg/dL (ref >39)
LDL Chol Calc (NIH): 63 mg/dL (ref 0–99)
Triglycerides: 171 mg/dL — ABNORMAL HIGH (ref 0–149)
VLDL Cholesterol Cal: 29 mg/dL (ref 5–40)

## 2019-10-18 LAB — HEMOGLOBIN A1C
Est. average glucose Bld gHb Est-mCnc: 137 mg/dL
Hgb A1c MFr Bld: 6.4 % — ABNORMAL HIGH (ref 4.8–5.6)

## 2019-10-18 LAB — TSH: TSH: 1.04 u[IU]/mL (ref 0.450–4.500)

## 2019-10-31 ENCOUNTER — Encounter: Payer: Self-pay | Admitting: Registered Nurse

## 2019-10-31 DIAGNOSIS — N289 Disorder of kidney and ureter, unspecified: Secondary | ICD-10-CM

## 2019-11-04 ENCOUNTER — Other Ambulatory Visit: Payer: Self-pay | Admitting: Family Medicine

## 2019-11-04 NOTE — Telephone Encounter (Signed)
Requested Prescriptions  Pending Prescriptions Disp Refills  . amLODipine (NORVASC) 5 MG tablet [Pharmacy Med Name: AMLODIPINE BESYLATE 5MG  TABLETS] 90 tablet 0    Sig: TAKE 1 TABLET(5 MG) BY MOUTH DAILY     Cardiovascular:  Calcium Channel Blockers Passed - 11/04/2019  7:00 AM      Passed - Last BP in normal range    BP Readings from Last 1 Encounters:  10/17/19 133/67         Passed - Valid encounter within last 6 months    Recent Outpatient Visits          2 weeks ago Essential hypertension   [facility] at [facility], Richard, NP   5 months ago Elevated BP without diagnosis of hypertension   [facility], Oneita Jolly, MD   5 months ago PAD (peripheral artery disease) [facility])   [facility] Care at [facility], Oneita Jolly, MD   6 months ago Decreased pedal pulses   [facility], Oneita Jolly, MD   7 months ago Need for prophylactic vaccination and inoculation against influenza   Primary Care at [facility], [facility], MD      Future Appointments            In 5 months Manus Rudd, MD Primary Care at [place], [facility]

## 2019-12-26 ENCOUNTER — Other Ambulatory Visit: Payer: Self-pay | Admitting: Family Medicine

## 2019-12-26 MED ORDER — ROSUVASTATIN CALCIUM 20 MG PO TABS: 20.0000 mg | ORAL_TABLET | Freq: Every day | ORAL | 1 refills | Status: DC

## 2019-12-26 NOTE — Telephone Encounter (Signed)
Copied from CRM [id number]. Topic: Quick Communication - Rx Refill/Question >> Dec 26, 2019  8:59 AM Jaquita Rector A wrote: Medication:   Has the patient contacted their pharmacy? Yes.   (Agent: If no, request that the patient contact the pharmacy for the refill.) (Agent: If yes, when and what did the pharmacy advise?)  Preferred Pharmacy (with phone number or street name): [id number] [place], [place] - [address] AT [facility] OF [facility] & [address]  Phone:  [phone] Fax:  [phone]     Agent: Please be advised that RX refills may take up to 3 business days. We ask that you follow-up with your pharmacy.

## 2019-12-28 ENCOUNTER — Telehealth: Payer: Self-pay | Admitting: Family Medicine

## 2019-12-28 NOTE — Telephone Encounter (Signed)
What is the name of the medication? rosuvastatin (CRESTOR) 20 MG tablet [[id number]]    Have you contacted your pharmacy to request a refill? Pt has the script now she is unsure why she is on this new script. She is not going to take crestor until she speaks with someone in our office. Please advise.  Which pharmacy would you like this sent to? Pharmacy  [facility] #[id number] [place], [place] - [phone] AT [facility] OF [facility] & [address]  [address] [place] [address]  Phone:  [phone] Fax:  [phone]       Patient notified that their request is being sent to the clinical staff for review and that they should receive a call once it is complete. If they do not receive a call within 72 hours they can check with their pharmacy or our office.

## 2019-12-28 NOTE — Telephone Encounter (Signed)
Patient states that she went to the pharmacy to pick up her medication and noticed she no longer have the Atorvastatin 40 mg but now have Rouvastatin 20MG . And was wondering was that correct. Please Advise.

## 2019-12-30 NOTE — Telephone Encounter (Signed)
Yes I changed her prescription back in September 2020 as her cholesterol was above goal. We will check her cholesterol again at next office visit. thanks

## 2019-12-30 NOTE — Telephone Encounter (Signed)
Informed patient she is in fact supposed to now start taking rosuvastatin 20 mg  (crestor) instead of atorvastatin ( lipitor ) . She verbalized understanding.

## 2020-02-02 ENCOUNTER — Other Ambulatory Visit: Payer: Self-pay | Admitting: Family Medicine

## 2020-02-13 ENCOUNTER — Other Ambulatory Visit: Payer: Self-pay | Admitting: Family Medicine

## 2020-02-13 DIAGNOSIS — Z1231 Encounter for screening mammogram for malignant neoplasm of breast: Secondary | ICD-10-CM

## 2020-03-14 ENCOUNTER — Telehealth: Payer: Self-pay | Admitting: *Deleted

## 2020-03-14 NOTE — Telephone Encounter (Signed)
Schedule AWV after 9-23 

## 2020-03-16 ENCOUNTER — Ambulatory Visit: Payer: Medicare HMO

## 2020-03-21 ENCOUNTER — Telehealth: Payer: Self-pay | Admitting: Family Medicine

## 2020-03-21 NOTE — Telephone Encounter (Signed)
Pt called and is wondering why she was referred to a Nephrologist. Pt stated she just got a call regarding this and was confused on why this referral was put in. This is from the visit on 10/17/19. Pt would like a call. Please advise.

## 2020-03-28 ENCOUNTER — Ambulatory Visit
Admission: RE | Admit: 2020-03-28 | Discharge: 2020-03-28 | Disposition: A | Discharge status: Home / Self Care | Payer: Medicare HMO | Source: Ambulatory Visit | Attending: Family Medicine | Admitting: Family Medicine

## 2020-03-28 ENCOUNTER — Other Ambulatory Visit: Payer: Self-pay

## 2020-03-28 DIAGNOSIS — Z1231 Encounter for screening mammogram for malignant neoplasm of breast: Secondary | ICD-10-CM

## 2020-04-01 ENCOUNTER — Other Ambulatory Visit: Payer: Self-pay | Admitting: Family Medicine

## 2020-04-02 ENCOUNTER — Ambulatory Visit (INDEPENDENT_AMBULATORY_CARE_PROVIDER_SITE_OTHER): Payer: Medicare HMO | Admitting: Family Medicine

## 2020-04-02 VITALS — BP 133/67 | Ht 59.0 in | Wt 124.0 lb

## 2020-04-02 DIAGNOSIS — Z0000 Encounter for general adult medical examination without abnormal findings: Secondary | ICD-10-CM | POA: Diagnosis not present

## 2020-04-02 NOTE — Patient Instructions (Addendum)
Thank you for taking time to come for your Medicare Wellness Visit. I appreciate your ongoing commitment to your health goals. Please review the following plan we discussed and let me know if I can assist you in the future.  Saria Haran LPN  Preventive Care 71 Years and Older, Female Preventive care refers to lifestyle choices and visits with your health care provider that can promote health and wellness. This includes:  A yearly physical exam. This is also called an annual well check.  Regular dental and eye exams.  Immunizations.  Screening for certain conditions.  Healthy lifestyle choices, such as diet and exercise. What can I expect for my preventive care visit? Physical exam Your health care provider will check:  Height and weight. These may be used to calculate body mass index (BMI), which is a measurement that tells if you are at a healthy weight.  Heart rate and blood pressure.  Your skin for abnormal spots. Counseling Your health care provider may ask you questions about:  Alcohol, tobacco, and drug use.  Emotional well-being.  Home and relationship well-being.  Sexual activity.  Eating habits.  History of falls.  Memory and ability to understand (cognition).  Work and work environment.  Pregnancy and menstrual history. What immunizations do I need?  Influenza (flu) vaccine  This is recommended every year. Tetanus, diphtheria, and pertussis (Tdap) vaccine  You may need a Td booster every 10 years. Varicella (chickenpox) vaccine  You may need this vaccine if you have not already been vaccinated. Zoster (shingles) vaccine  You may need this after age 60. Pneumococcal conjugate (PCV13) vaccine  One dose is recommended after age 71. Pneumococcal polysaccharide (PPSV23) vaccine  One dose is recommended after age 71. Measles, mumps, and rubella (MMR) vaccine  You may need at least one dose of MMR if you were born in 1957 or later. You may also  need a second dose. Meningococcal conjugate (MenACWY) vaccine  You may need this if you have certain conditions. Hepatitis A vaccine  You may need this if you have certain conditions or if you travel or work in places where you may be exposed to hepatitis A. Hepatitis B vaccine  You may need this if you have certain conditions or if you travel or work in places where you may be exposed to hepatitis B. Haemophilus influenzae type b (Hib) vaccine  You may need this if you have certain conditions. You may receive vaccines as individual doses or as more than one vaccine together in one shot (combination vaccines). Talk with your health care provider about the risks and benefits of combination vaccines. What tests do I need? Blood tests  Lipid and cholesterol levels. These may be checked every 5 years, or more frequently depending on your overall health.  Hepatitis C test.  Hepatitis B test. Screening  Lung cancer screening. You may have this screening every year starting at age 55 if you have a 30-pack-year history of smoking and currently smoke or have quit within the past 15 years.  Colorectal cancer screening. All adults should have this screening starting at age 50 and continuing until age 75. Your health care provider may recommend screening at age 45 if you are at increased risk. You will have tests every 1-10 years, depending on your results and the type of screening test.  Diabetes screening. This is done by checking your blood sugar (glucose) after you have not eaten for a while (fasting). You may have this done every 1-3   years.  Mammogram. This may be done every 1-2 years. Talk with your health care provider about how often you should have regular mammograms.  BRCA-related cancer screening. This may be done if you have a family history of breast, ovarian, tubal, or peritoneal cancers. Other tests  Sexually transmitted disease (STD) testing.  Bone density scan. This is done  to screen for osteoporosis. You may have this done starting at age 94. Follow these instructions at home: Eating and drinking  Eat a diet that includes fresh fruits and vegetables, whole grains, lean protein, and low-fat dairy products. Limit your intake of foods with high amounts of sugar, saturated fats, and salt.  Take vitamin and mineral supplements as recommended by your health care provider.  Do not drink alcohol if your health care provider tells you not to drink.  If you drink alcohol: ? Limit how much you have to 0-1 drink a day. ? Be aware of how much alcohol is in your drink. In the [place] one drink equals one 12 oz bottle of beer (355 mL), one 5 oz glass of wine (148 mL), or one 1 oz glass of hard liquor (44 mL). Lifestyle  Take daily care of your teeth and gums.  Stay active. Exercise for at least 30 minutes on 5 or more days each week.  Do not use any products that contain nicotine or tobacco, such as cigarettes, e-cigarettes, and chewing tobacco. If you need help quitting, ask your health care provider.  If you are sexually active, practice safe sex. Use a condom or other form of protection in order to prevent STIs (sexually transmitted infections).  Talk with your health care provider about taking a low-dose aspirin or statin. What's next?  Go to your health care provider once a year for a well check visit.  Ask your health care provider how often you should have your eyes and teeth checked.  Stay up to date on all vaccines. This information is not intended to replace advice given to you by your health care provider. Make sure you discuss any questions you have with your health care provider. Document Revised: 06/17/2018 Document Reviewed: 06/17/2018 Elsevier Patient Education  2020 [employer].

## 2020-04-02 NOTE — Progress Notes (Signed)
Presents today for [employer] Visit   Date of last exam: 10-17-2019  Interpreter used for this visit? No  I connected with  Sharon Peterson on 04/02/20 by a telephone application and verified that I am speaking with the correct person using two identifiers.   I discussed the limitations of evaluation and management by telemedicine. The patient expressed understanding and agreed to proceed.  Patient location: home  Provider location: in office  I provided 20 minutes of non face - to - face time during this encounter.   Patient Care Team: Myles Lipps, MD as PCP - General (Family Medicine)   Other items to address today:  Discussed Eye/Dental Discussed Immunizations   Other Screening: Last screening for diabetes: 10/17/2019 Last lipid screening: 10/17/2019  ADVANCE DIRECTIVES: Discussed: yes On File: no Materials Provided: yes  Immunization status:  Immunization History  Administered Date(s) Administered   Fluad Quad(high Dose 65+) 03/30/2019   Influenza, High Dose Seasonal PF 09/24/2018   Influenza,inj,Quad PF,6+ Mos 05/27/2013, 06/01/2015, 05/22/2016, 05/14/2017   PFIZER SARS-COV-2 Vaccination 10/03/2019, 10/17/2019   Pneumococcal Conjugate-13 11/29/2015   Tdap 07/29/2013     Health Maintenance Due  Topic Date Due   FOOT EXAM  09/24/2019   URINE MICROALBUMIN  09/24/2019   INFLUENZA VACCINE  02/05/2020   OPHTHALMOLOGY EXAM  03/27/2020     Functional Status Survey: Is the patient deaf or have difficulty hearing?: No Does the patient have difficulty seeing, even when wearing glasses/contacts?: No Does the patient have difficulty concentrating, remembering, or making decisions?: No Does the patient have difficulty walking or climbing stairs?: No Does the patient have difficulty dressing or bathing?: No Does the patient have difficulty doing errands alone such as visiting a doctor's office or shopping?: No   6CIT Screen  04/02/2020 03/30/2019  What Year? 0 points 0 points  What month? 0 points 0 points  What time? 0 points 0 points  Count back from 20 0 points 0 points  Months in reverse 0 points 0 points  Repeat phrase 0 points 0 points  Total Score 0 0        Office Visit from 03/30/2019 in [facility] at [place]  AUDIT-C Score 0       Home Environment:   No trouble climbing stairs Lives in one story home No scattered rugs No grab bars Aquate lighting/ no clutter   Patient Active Problem List   Diagnosis Date Noted   PAD (peripheral artery disease) (HCC) 05/20/2019   Acute pain of left shoulder 01/12/2018   Acute bursitis of left shoulder 01/12/2018   Vitamin D deficiency 06/02/2015   Type 2 diabetes mellitus without complication, without long-term current use of insulin (HCC) 06/02/2015   Osteopenia 06/01/2015   Hyperlipidemia 06/04/2013   Tobacco use disorder 06/04/2013     Past Medical History:  Diagnosis Date   Hyperlipidemia    Hypertension    Osteopenia of the elderly 12/16/2013   T score -1.3 at left femur     History reviewed. No pertinent surgical history.   Family History  Problem Relation Age of Onset   Breast cancer Neg Hx      Social History   Socioeconomic History   Marital status: Single    Spouse name: Not on file   Number of children: Not on file   Years of education: Not on file   Highest education level: Not on file  Occupational History   Not on file  Tobacco  Use   Smoking status: Current Every Day Smoker    Packs/day: 0.50    Years: 56.00    Pack years: 28.00    Types: Cigarettes   Smokeless tobacco: Never Used  Vaping Use   Vaping Use: Never used  Substance and Sexual Activity   Alcohol use: No   Drug use: No   Sexual activity: Not Currently  Other Topics Concern   Not on file  Social History Narrative   Significant other. Education: [employer].   Social Determinants of Health   Financial Resource  Strain:    Difficulty of Paying Living Expenses: Not on file  Food Insecurity:    Worried About Programme researcher, broadcasting/film/video in the Last Year: Not on file   [employer] of Food in the Last Year: Not on file  Transportation Needs:    Lack of Transportation (Medical): Not on file   Lack of Transportation (Non-Medical): Not on file  Physical Activity:    Days of Exercise per Week: Not on file   Minutes of Exercise per Session: Not on file  Stress:    Feeling of Stress : Not on file  Social Connections:    Frequency of Communication with Friends and Family: Not on file   Frequency of Social Gatherings with Friends and Family: Not on file   Attends Religious Services: Not on file   Active Member of Clubs or Organizations: Not on file   Attends Banker Meetings: Not on file   Marital Status: Not on file  Intimate Partner Violence:    Fear of Current or Ex-Partner: Not on file   Emotionally Abused: Not on file   Physically Abused: Not on file   Sexually Abused: Not on file     No Known Allergies   Prior to Admission medications   Medication Sig Start Date End Date Taking? Authorizing Provider  amLODipine (NORVASC) 5 MG tablet TAKE 1 TABLET(5 MG) BY MOUTH DAILY 02/02/20  Yes Myles Lipps, MD  Omega-3 Fatty Acids (FISH OIL PO) Take by mouth.   Yes [provider]  rosuvastatin (CRESTOR) 20 MG tablet Take 1 tablet (20 mg total) by mouth daily. 12/26/19  Yes Myles Lipps, MD  Vitamin D, Ergocalciferol, (DRISDOL) 1.25 MG (50000 UT) CAPS capsule Take 1 capsule (50,000 Units total) by mouth every 7 (seven) days. Patient not taking: Reported on 10/17/2019 04/26/19   Myles Lipps, MD     Depression screen [facility] 2/9 04/02/2020 10/17/2019 05/27/2019 05/20/2019 04/18/2019  Decreased Interest 0 0 0 0 0  Down, Depressed, Hopeless 0 0 0 0 0  PHQ - 2 Score 0 0 0 0 0     Fall Risk  04/02/2020 10/17/2019 05/27/2019 05/20/2019 04/18/2019  Falls in the past year? 0  0 0 0 0  Number falls in past yr: 0 0 0 0 0  Injury with Fall? 0 0 0 0 0  Follow up Falls evaluation completed;Education provided Falls evaluation completed - - -      PHYSICAL EXAM: BP 133/67 Comment: taken from a previous visit/not in clinic   Ht 4\' 11"  (1.499 m)    Wt 124 lb (56.2 kg)    BMI 25.04 kg/m    Wt Readings from Last 3 Encounters:  04/02/20 124 lb (56.2 kg)  10/17/19 124 lb 6.4 oz (56.4 kg)  06/21/19 127 lb (57.6 kg)      Education/Counseling provided regarding diet and exercise, prevention of chronic diseases, smoking/tobacco cessation, if  applicable, and reviewed "Covered Medicare Preventive Services."

## 2020-04-13 ENCOUNTER — Ambulatory Visit (INDEPENDENT_AMBULATORY_CARE_PROVIDER_SITE_OTHER): Payer: Medicare HMO | Admitting: Family Medicine

## 2020-04-13 ENCOUNTER — Encounter: Payer: Self-pay | Admitting: Family Medicine

## 2020-04-13 ENCOUNTER — Other Ambulatory Visit: Payer: Self-pay

## 2020-04-13 VITALS — BP 144/60 | HR 68 | Temp 97.6°F | Ht 59.0 in | Wt 120.0 lb

## 2020-04-13 DIAGNOSIS — Z23 Encounter for immunization: Secondary | ICD-10-CM

## 2020-04-13 DIAGNOSIS — R5383 Other fatigue: Secondary | ICD-10-CM

## 2020-04-13 DIAGNOSIS — E559 Vitamin D deficiency, unspecified: Secondary | ICD-10-CM | POA: Diagnosis not present

## 2020-04-13 DIAGNOSIS — I10 Essential (primary) hypertension: Secondary | ICD-10-CM

## 2020-04-13 DIAGNOSIS — E782 Mixed hyperlipidemia: Secondary | ICD-10-CM

## 2020-04-13 DIAGNOSIS — E119 Type 2 diabetes mellitus without complications: Secondary | ICD-10-CM | POA: Diagnosis not present

## 2020-04-13 MED ORDER — AMLODIPINE BESYLATE 10 MG PO TABS: 10.0000 mg | ORAL_TABLET | Freq: Every day | ORAL | 1 refills | Status: DC

## 2020-04-13 MED ORDER — ROSUVASTATIN CALCIUM 20 MG PO TABS
20.0000 mg | ORAL_TABLET | Freq: Every day | ORAL | 1 refills | Status: DC
Start: 2020-04-13 — End: 2020-09-27

## 2020-04-13 NOTE — Progress Notes (Signed)
10/8/20212:12 PM  Sharon Peterson 1951/04/13, 69 y.o., female [id number]  Chief Complaint  Patient presents with  . Medical Management of Chronic Issues    6 m f/u   . feeling lazy and tired    HPI:   Patient is a 69 y.o. female with past medical history significant for HTN, DM2, HLP, tob use, vitamin D deficiency who presents today for followup  Last OV April 2021 Kateri Plummer NP  She is overall doing well For past several months has been struggling with feeling tired, unmotivated Her adult son has moved in with her due to his health issues and patient feeling used with increase responsibilities She denies any depression She continues to smoke, has no interest in quiting  Lab Results  Component Value Date   HGBA1C 6.4 (H) 10/17/2019   HGBA1C 6.5 (H) 03/30/2019   HGBA1C 6.8 (A) 09/24/2018   Lab Results  Component Value Date   MICROALBUR 0.7 11/29/2015   LDLCALC 63 10/17/2019   CREATININE 1.33 (H) 10/17/2019   Wt Readings from Last 3 Encounters:  04/13/20 120 lb (54.4 kg)  04/02/20 124 lb (56.2 kg)  10/17/19 124 lb 6.4 oz (56.4 kg)   BP Readings from Last 3 Encounters:  04/13/20 (!) 144/60  04/02/20 133/67  10/17/19 133/67    Depression screen PHQ 2/9 04/13/2020 04/02/2020 10/17/2019  Decreased Interest 0 0 0  Down, Depressed, Hopeless 0 0 0  PHQ - 2 Score 0 0 0    Fall Risk  04/13/2020 04/02/2020 10/17/2019 05/27/2019 05/20/2019  Falls in the past year? 1 0 0 0 0  Number falls in past yr: 0 0 0 0 0  Injury with Fall? 1 0 0 0 0  Follow up Falls evaluation completed Falls evaluation completed;Education provided Falls evaluation completed - -     No Known Allergies  Prior to Admission medications   Medication Sig Start Date End Date Taking? Authorizing Provider  amLODipine (NORVASC) 5 MG tablet TAKE 1 TABLET(5 MG) BY MOUTH DAILY 02/02/20  Yes Myles Lipps, MD  Omega-3 Fatty Acids (FISH OIL PO) Take by mouth.   Yes [provider]  rosuvastatin  (CRESTOR) 20 MG tablet Take 1 tablet (20 mg total) by mouth daily. 12/26/19  Yes Myles Lipps, MD    Past Medical History:  Diagnosis Date  . Hyperlipidemia   . Hypertension   . Osteopenia of the elderly 12/16/2013   T score -1.3 at left femur    No past surgical history on file.  Social History   Tobacco Use  . Smoking status: Current Every Day Smoker    Packs/day: 0.50    Years: 56.00    Pack years: 28.00    Types: Cigarettes  . Smokeless tobacco: Never Used  Substance Use Topics  . Alcohol use: No    Family History  Problem Relation Age of Onset  . Breast cancer Neg Hx     Review of Systems  Constitutional: Negative for chills and fever.  Respiratory: Negative for cough and shortness of breath.   Cardiovascular: Negative for chest pain, palpitations and leg swelling.  Gastrointestinal: Negative for abdominal pain, nausea and vomiting.     OBJECTIVE:  Today's Vitals   04/13/20 1349 04/13/20 1400  BP: (!) 145/65 (!) 144/60  Pulse: 68   Temp: 97.6 F (36.4 C)   TempSrc: Temporal   SpO2: 100%   Weight: 120 lb (54.4 kg)   Height: 4\' 11"  (1.499 m)  Body mass index is 24.24 kg/m.   Physical Exam Vitals and nursing note reviewed.  Constitutional:      Appearance: She is well-developed.  HENT:     Head: Normocephalic and atraumatic.     Mouth/Throat:     Pharynx: No oropharyngeal exudate.  Eyes:     General: No scleral icterus.    Extraocular Movements: Extraocular movements intact.     Conjunctiva/sclera: Conjunctivae normal.     Pupils: Pupils are equal, round, and reactive to light.  Cardiovascular:     Rate and Rhythm: Normal rate and regular rhythm.     Heart sounds: Normal heart sounds. No murmur heard.  No friction rub. No gallop.   Pulmonary:     Effort: Pulmonary effort is normal.     Breath sounds: Normal breath sounds. No wheezing, rhonchi or rales.  Musculoskeletal:     Cervical back: Neck supple.     Right lower leg: No edema.      Left lower leg: No edema.  Skin:    General: Skin is warm and dry.  Neurological:     Mental Status: She is alert and oriented to person, place, and time.     No results found for this or any previous visit (from the past 24 hour(s)).  No results found.   ASSESSMENT and PLAN  1. Type 2 diabetes mellitus without complication, without long-term current use of insulin (HCC) Diet controlled. Labs pending - Microalbumin / creatinine urine ratio - Hemoglobin A1c  2. Essential hypertension Not at goal, increase amlodipine to 10mg  daily  3. Mixed hyperlipidemia LDL goal < 70 given PAD, labs pending, meds to be adjusted as needed - Comprehensive metabolic panel - Lipid panel  4. Vitamin D deficiency Checking labs today, medications will be adjusted as needed.  - Vitamin D, 25-hydroxy  5. Need for influenza vaccination - Flu Vaccine QUAD High Dose(Fluad)  6. Fatigue, unspecified type Mostly from family stressors, labs to r/o organic causes. Declines counseling at this time - CBC - TSH  Other orders - amLODipine (NORVASC) 10 MG tablet; Take 1 tablet (10 mg total) by mouth daily. - rosuvastatin (CRESTOR) 20 MG tablet; Take 1 tablet (20 mg total) by mouth daily.  Return in about 6 months (around 10/12/2020).    12/12/2020, MD [facility] at [facility] [address] [place], [place] Ph.  [phone] Fax [phone]

## 2020-04-13 NOTE — Patient Instructions (Signed)
° ° ° °  If you have lab work done today you will be contacted with your lab results within the next 2 weeks.  If you have not heard from us then please contact us. The fastest way to get your results is to register for My Chart. ° ° °IF you received an x-ray today, you will receive an invoice from [facility]. Please contact [facility] at [phone] with questions or concerns regarding your invoice.  ° °IF you received labwork today, you will receive an invoice from [facility]. Please contact [facility] at [phone] with questions or concerns regarding your invoice.  ° °Our billing staff will not be able to assist you with questions regarding bills from these companies. ° °You will be contacted with the lab results as soon as they are available. The fastest way to get your results is to activate your My Chart account. Instructions are located on the last page of this paperwork. If you have not heard from us regarding the results in 2 weeks, please contact this office. °  ° ° ° °

## 2020-04-14 LAB — MICROALBUMIN / CREATININE URINE RATIO
Creatinine, Urine: 70.8 mg/dL
Microalb/Creat Ratio: 185 mg/g creat — ABNORMAL HIGH (ref 0–29)
Microalbumin, Urine: 130.9 ug/mL

## 2020-04-14 LAB — COMPREHENSIVE METABOLIC PANEL
ALT: 16 IU/L (ref 0–32)
AST: 21 IU/L (ref 0–40)
Albumin/Globulin Ratio: 1.8 (ref 1.2–2.2)
Albumin: 4.7 g/dL (ref 3.7–4.7)
Alkaline Phosphatase: 102 IU/L (ref 44–121)
BUN/Creatinine Ratio: 17 (ref 12–28)
BUN: 18 mg/dL (ref 8–27)
Bilirubin Total: 0.3 mg/dL (ref 0.0–1.2)
CO2: 22 mmol/L (ref 20–29)
Calcium: 9.9 mg/dL (ref 8.7–10.3)
Chloride: 103 mmol/L (ref 96–106)
Creatinine, Ser: 1.09 mg/dL — ABNORMAL HIGH (ref 0.57–1.00)
GFR calc Af Amer: 59 mL/min/{1.73_m2} — ABNORMAL LOW (ref >59)
GFR calc non Af Amer: 51 mL/min/{1.73_m2} — ABNORMAL LOW (ref >59)
Globulin, Total: 2.6 g/dL (ref 1.5–4.5)
Glucose: 103 mg/dL — ABNORMAL HIGH (ref 65–99)
Potassium: 3.4 mmol/L — ABNORMAL LOW (ref 3.5–5.2)
Sodium: 140 mmol/L (ref 134–144)
Total Protein: 7.3 g/dL (ref 6.0–8.5)

## 2020-04-14 LAB — LIPID PANEL
Chol/HDL Ratio: 2.5 ratio (ref 0.0–4.4)
Cholesterol, Total: 139 mg/dL (ref 100–199)
HDL: 56 mg/dL (ref >39)
LDL Chol Calc (NIH): 66 mg/dL (ref 0–99)
Triglycerides: 91 mg/dL (ref 0–149)
VLDL Cholesterol Cal: 17 mg/dL (ref 5–40)

## 2020-04-14 LAB — TSH: TSH: 1.48 u[IU]/mL (ref 0.450–4.500)

## 2020-04-14 LAB — CBC
Hematocrit: 36.1 % (ref 34.0–46.6)
Hemoglobin: 11.9 g/dL (ref 11.1–15.9)
MCH: 25.8 pg — ABNORMAL LOW (ref 26.6–33.0)
MCHC: 33 g/dL (ref 31.5–35.7)
MCV: 78 fL — ABNORMAL LOW (ref 79–97)
Platelets: 226 10*3/uL (ref 150–450)
RBC: 4.61 x10E6/uL (ref 3.77–5.28)
RDW: 15.6 % — ABNORMAL HIGH (ref 11.7–15.4)
WBC: 8.1 10*3/uL (ref 3.4–10.8)

## 2020-04-14 LAB — VITAMIN D 25 HYDROXY (VIT D DEFICIENCY, FRACTURES): Vit D, 25-Hydroxy: 25.7 ng/mL — ABNORMAL LOW (ref 30.0–100.0)

## 2020-04-14 LAB — HEMOGLOBIN A1C
Est. average glucose Bld gHb Est-mCnc: 148 mg/dL
Hgb A1c MFr Bld: 6.8 % — ABNORMAL HIGH (ref 4.8–5.6)

## 2020-04-15 ENCOUNTER — Encounter: Payer: Self-pay | Admitting: Family Medicine

## 2020-04-15 MED ORDER — LISINOPRIL 20 MG PO TABS: 20.0000 mg | ORAL_TABLET | Freq: Every day | ORAL | 0 refills | Status: DC

## 2020-04-16 ENCOUNTER — Ambulatory Visit: Payer: Medicare HMO | Admitting: Family Medicine

## 2020-05-14 ENCOUNTER — Ambulatory Visit: Payer: Medicare HMO

## 2020-05-17 ENCOUNTER — Ambulatory Visit: Payer: Medicare HMO | Attending: Internal Medicine

## 2020-05-17 ENCOUNTER — Ambulatory Visit: Payer: Medicare HMO

## 2020-05-17 DIAGNOSIS — Z23 Encounter for immunization: Secondary | ICD-10-CM

## 2020-05-17 NOTE — Progress Notes (Signed)
   Covid-19 Vaccination Clinic  Name:  JEMA DEEGAN    MRN: [id number] DOB: 1950/10/05  05/17/2020  Ms. Yaeger was observed post Covid-19 immunization for 15 minutes without incident. She was provided with Vaccine Information Sheet and instruction to access the V-Safe system.   Ms. Docter was instructed to call 911 with any severe reactions post vaccine: Marland Kitchen Difficulty breathing  . Swelling of face and throat  . A fast heartbeat  . A bad rash all over body  . Dizziness and weakness

## 2020-09-23 IMAGING — MG MM DIGITAL SCREENING BILAT W/ TOMO W/ CAD
8 series · 9 of 24 positions shown · non-contrast
Comparison: Previous exam(s).

CLINICAL DATA: Screening.

EXAM:
DIGITAL SCREENING BILATERAL MAMMOGRAM WITH TOMO AND CAD

[L MLO synth-2D]
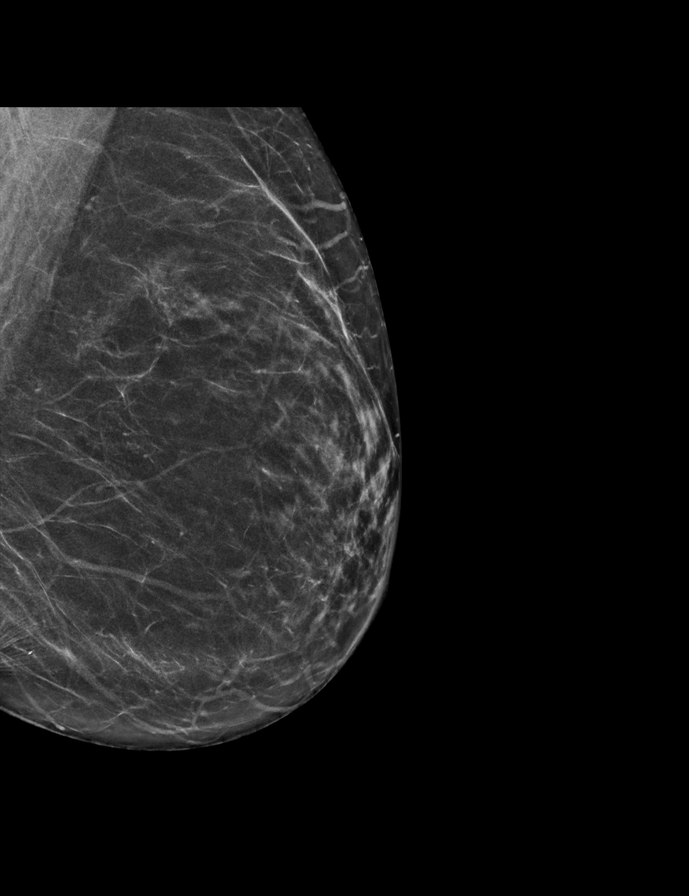

[L CC synth-2D]
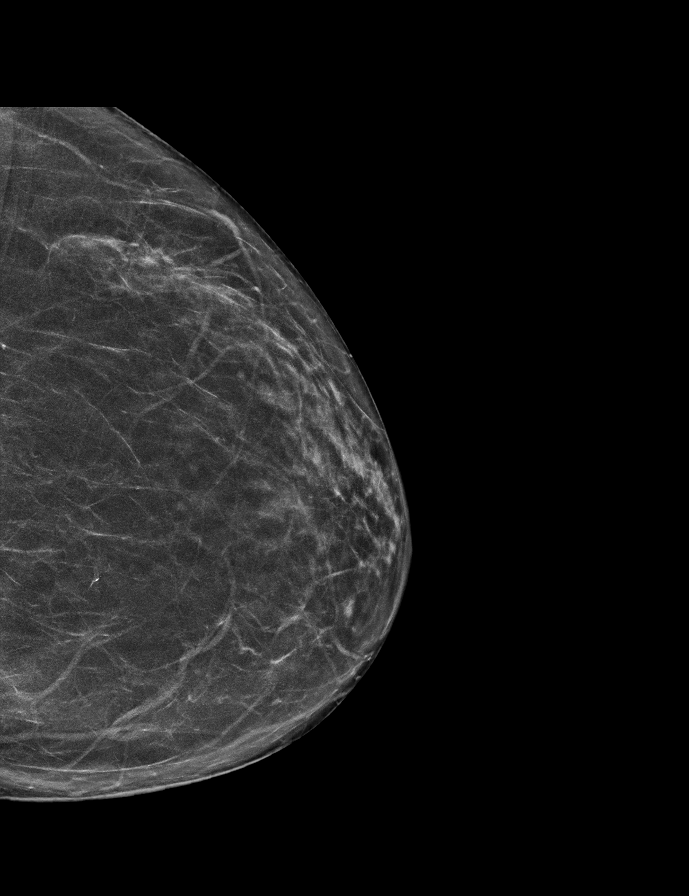

[R CC synth-2D]
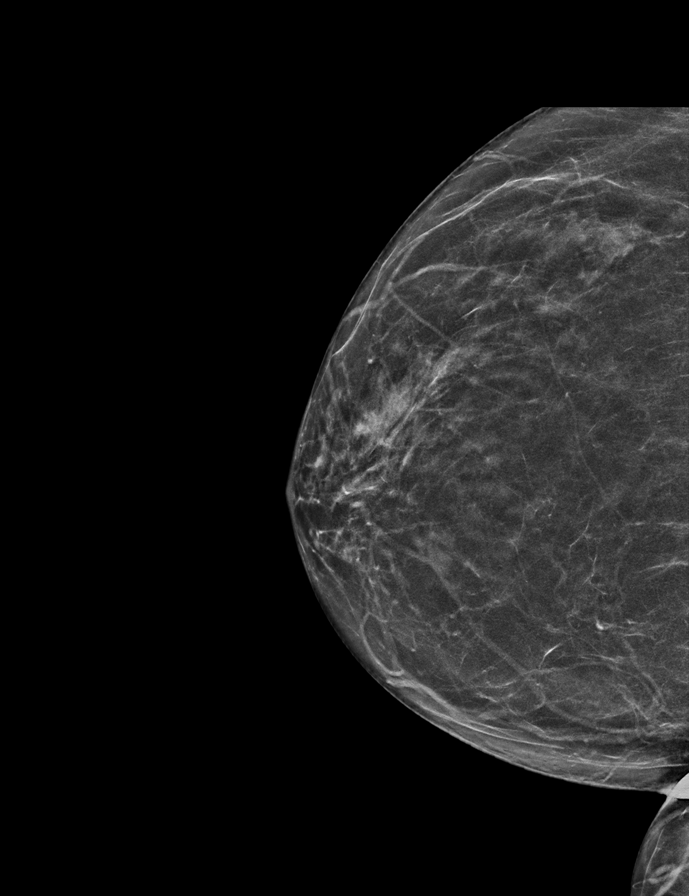

[R MLO synth-2D]
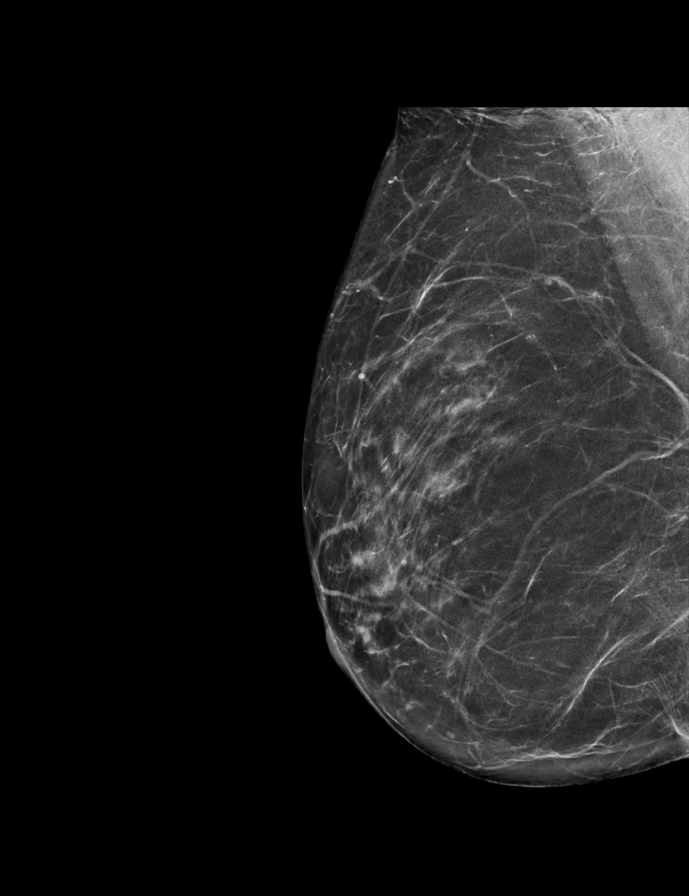

[R MLO tomo · 2 of 68 frames shown]
[frame 22/68]
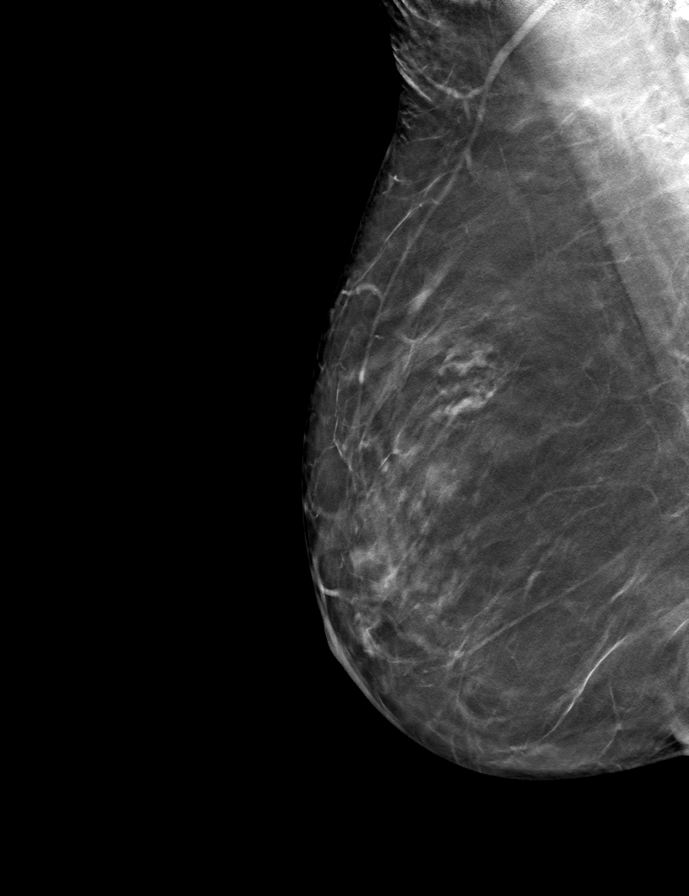
[frame 35/68]
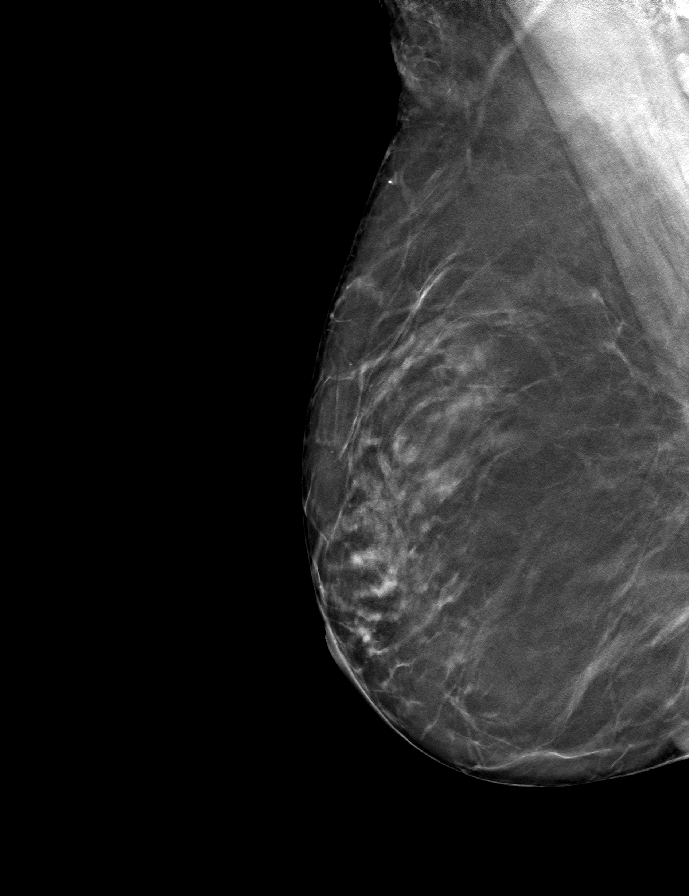

[L CC tomo · tomo slice 29/58.0]
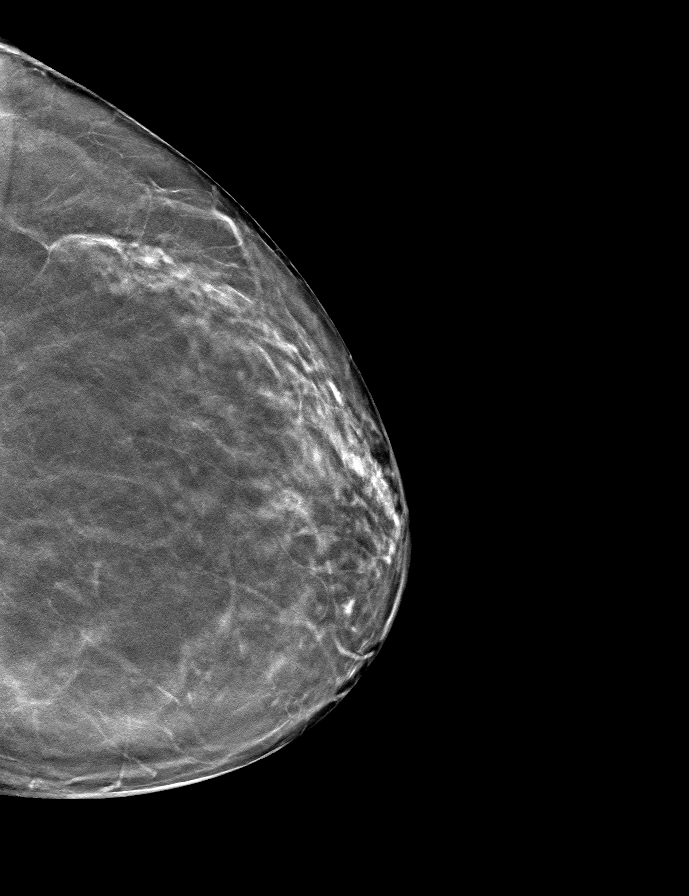

[R CC tomo · tomo slice 28/55.0]
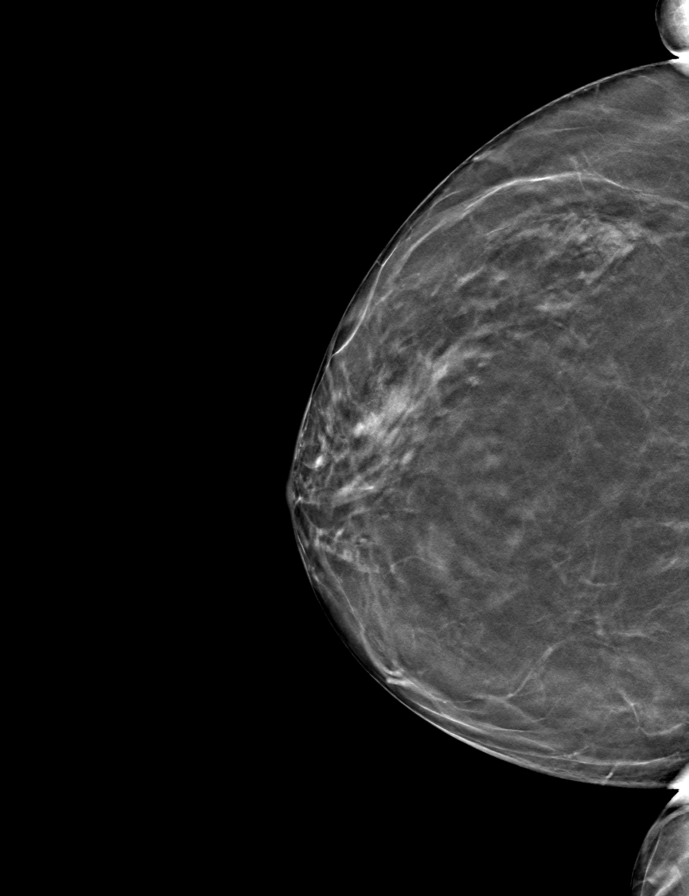

[L MLO tomo · tomo slice 29/58.0]
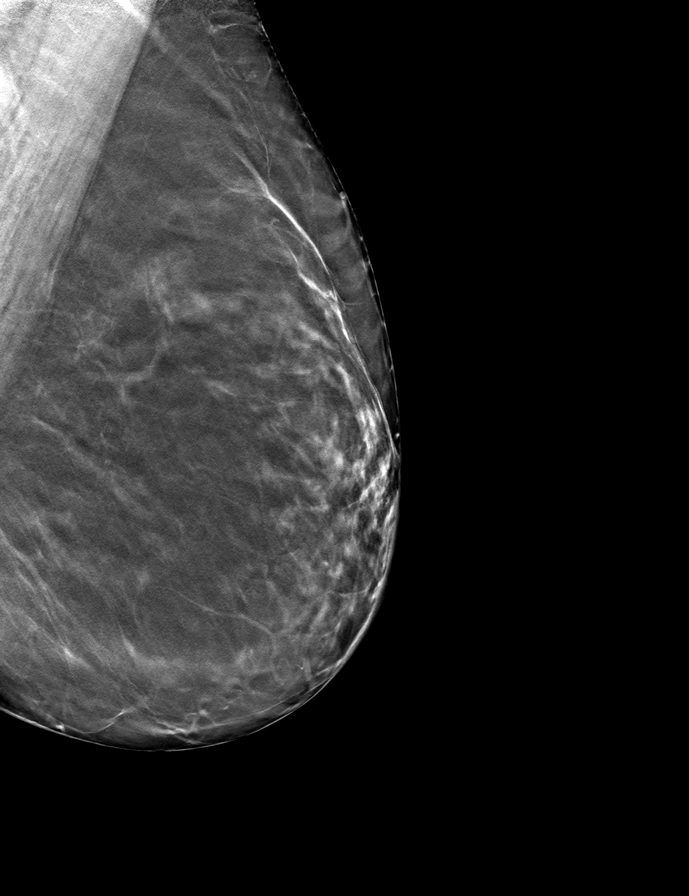

[9 of 24 positions shown; findings below may reference images not displayed]

ACR Breast Density Category b: There are scattered areas of
fibroglandular density.
FINDINGS: There are no findings suspicious for malignancy. Images were
processed with CAD.
IMPRESSION: No mammographic evidence of malignancy. A result letter of this
screening mammogram will be mailed directly to the patient.

RECOMMENDATION:
Screening mammogram in one year. (Code:[id number]-775)

BI-RADS CATEGORY  1: Negative.

## 2020-09-27 ENCOUNTER — Other Ambulatory Visit: Payer: Self-pay

## 2020-09-27 ENCOUNTER — Telehealth: Payer: Self-pay | Admitting: Family Medicine

## 2020-09-27 MED ORDER — LISINOPRIL 20 MG PO TABS: 20.0000 mg | ORAL_TABLET | Freq: Every day | ORAL | 0 refills | Status: AC

## 2020-09-27 MED ORDER — ROSUVASTATIN CALCIUM 20 MG PO TABS: 20.0000 mg | ORAL_TABLET | Freq: Every day | ORAL | 1 refills | Status: AC

## 2020-09-27 NOTE — Telephone Encounter (Signed)
Patient called to follow up on refill for  rosuvastatin (CRESTOR) 20 MG tablet [[id number]]    amLODipine (NORVASC) 10 MG tablet [[id number]] DISCONTINUED   [id number] [place] Otto, [place] - [address] AT [facility] OF [facility] & [address]  [address] [place], [place] [address]  Phone:  [phone] Fax:  [phone]  DEA #:  [id number]   Please advise at [phone]

## 2020-09-27 NOTE — Telephone Encounter (Signed)
Sent!

## 2020-10-15 ENCOUNTER — Encounter: Payer: Medicare HMO | Admitting: Family Medicine

## 2020-11-08 DIAGNOSIS — E785 Hyperlipidemia, unspecified: Secondary | ICD-10-CM | POA: Diagnosis not present

## 2020-11-08 DIAGNOSIS — R7309 Other abnormal glucose: Secondary | ICD-10-CM | POA: Diagnosis not present

## 2020-11-08 DIAGNOSIS — Z90710 Acquired absence of both cervix and uterus: Secondary | ICD-10-CM | POA: Diagnosis not present

## 2020-11-08 DIAGNOSIS — F1021 Alcohol dependence, in remission: Secondary | ICD-10-CM | POA: Diagnosis not present

## 2020-11-08 DIAGNOSIS — Z801 Family history of malignant neoplasm of trachea, bronchus and lung: Secondary | ICD-10-CM | POA: Diagnosis not present

## 2020-11-08 DIAGNOSIS — R739 Hyperglycemia, unspecified: Secondary | ICD-10-CM | POA: Diagnosis not present

## 2020-11-08 DIAGNOSIS — F17200 Nicotine dependence, unspecified, uncomplicated: Secondary | ICD-10-CM | POA: Diagnosis not present

## 2020-11-08 DIAGNOSIS — I10 Essential (primary) hypertension: Secondary | ICD-10-CM | POA: Diagnosis not present

## 2020-12-25 DIAGNOSIS — Z7984 Long term (current) use of oral hypoglycemic drugs: Secondary | ICD-10-CM | POA: Diagnosis not present

## 2020-12-25 DIAGNOSIS — I10 Essential (primary) hypertension: Secondary | ICD-10-CM | POA: Diagnosis not present

## 2020-12-25 DIAGNOSIS — F17200 Nicotine dependence, unspecified, uncomplicated: Secondary | ICD-10-CM | POA: Diagnosis not present

## 2020-12-25 DIAGNOSIS — Z90710 Acquired absence of both cervix and uterus: Secondary | ICD-10-CM | POA: Diagnosis not present

## 2020-12-25 DIAGNOSIS — B360 Pityriasis versicolor: Secondary | ICD-10-CM | POA: Diagnosis not present

## 2020-12-25 DIAGNOSIS — Z0000 Encounter for general adult medical examination without abnormal findings: Secondary | ICD-10-CM | POA: Diagnosis not present

## 2020-12-25 DIAGNOSIS — E1169 Type 2 diabetes mellitus with other specified complication: Secondary | ICD-10-CM | POA: Diagnosis not present

## 2021-02-13 DIAGNOSIS — E1169 Type 2 diabetes mellitus with other specified complication: Secondary | ICD-10-CM | POA: Diagnosis not present

## 2021-02-13 DIAGNOSIS — I10 Essential (primary) hypertension: Secondary | ICD-10-CM | POA: Diagnosis not present

## 2021-02-13 DIAGNOSIS — Z7984 Long term (current) use of oral hypoglycemic drugs: Secondary | ICD-10-CM | POA: Diagnosis not present

## 2021-02-13 DIAGNOSIS — F17200 Nicotine dependence, unspecified, uncomplicated: Secondary | ICD-10-CM | POA: Diagnosis not present

## 2021-03-04 ENCOUNTER — Other Ambulatory Visit: Payer: Self-pay | Admitting: Family Medicine

## 2021-03-04 ENCOUNTER — Other Ambulatory Visit: Payer: Self-pay | Admitting: *Deleted

## 2021-03-04 DIAGNOSIS — Z1231 Encounter for screening mammogram for malignant neoplasm of breast: Secondary | ICD-10-CM

## 2021-04-08 ENCOUNTER — Ambulatory Visit
Admission: RE | Admit: 2021-04-08 | Discharge: 2021-04-08 | Disposition: A | Discharge status: Home / Self Care | Payer: Medicare HMO | Source: Ambulatory Visit | Attending: *Deleted | Admitting: *Deleted

## 2021-04-08 ENCOUNTER — Other Ambulatory Visit: Payer: Self-pay

## 2021-04-08 DIAGNOSIS — Z1231 Encounter for screening mammogram for malignant neoplasm of breast: Secondary | ICD-10-CM

## 2021-04-29 DIAGNOSIS — I10 Essential (primary) hypertension: Secondary | ICD-10-CM | POA: Diagnosis not present

## 2021-04-29 DIAGNOSIS — E1169 Type 2 diabetes mellitus with other specified complication: Secondary | ICD-10-CM | POA: Diagnosis not present

## 2021-04-29 DIAGNOSIS — Z23 Encounter for immunization: Secondary | ICD-10-CM | POA: Diagnosis not present

## 2021-04-29 DIAGNOSIS — F17200 Nicotine dependence, unspecified, uncomplicated: Secondary | ICD-10-CM | POA: Diagnosis not present

## 2021-04-29 DIAGNOSIS — Z7984 Long term (current) use of oral hypoglycemic drugs: Secondary | ICD-10-CM | POA: Diagnosis not present

## 2021-06-04 ENCOUNTER — Emergency Department (HOSPITAL_COMMUNITY): Payer: Medicare HMO

## 2021-06-04 ENCOUNTER — Encounter (HOSPITAL_COMMUNITY): Payer: Self-pay | Admitting: Emergency Medicine

## 2021-06-04 ENCOUNTER — Emergency Department (HOSPITAL_COMMUNITY)
Admission: EM | Admit: 2021-06-04 | Discharge: 2021-06-05 | Disposition: A | Discharge status: Home / Self Care | Payer: Medicare HMO | Attending: Emergency Medicine | Admitting: Emergency Medicine

## 2021-06-04 ENCOUNTER — Telehealth (HOSPITAL_COMMUNITY): Payer: Self-pay | Admitting: Radiology

## 2021-06-04 ENCOUNTER — Other Ambulatory Visit: Payer: Self-pay

## 2021-06-04 DIAGNOSIS — Z5321 Procedure and treatment not carried out due to patient leaving prior to being seen by health care provider: Secondary | ICD-10-CM | POA: Insufficient documentation

## 2021-06-04 DIAGNOSIS — I6782 Cerebral ischemia: Secondary | ICD-10-CM | POA: Diagnosis not present

## 2021-06-04 DIAGNOSIS — R9431 Abnormal electrocardiogram [ECG] [EKG]: Secondary | ICD-10-CM | POA: Diagnosis not present

## 2021-06-04 DIAGNOSIS — R4781 Slurred speech: Secondary | ICD-10-CM | POA: Diagnosis not present

## 2021-06-04 DIAGNOSIS — R4701 Aphasia: Secondary | ICD-10-CM | POA: Diagnosis not present

## 2021-06-04 DIAGNOSIS — R479 Unspecified speech disturbances: Secondary | ICD-10-CM | POA: Diagnosis not present

## 2021-06-04 DIAGNOSIS — R4789 Other speech disturbances: Secondary | ICD-10-CM | POA: Diagnosis not present

## 2021-06-04 LAB — CBC
HCT: 34 % — ABNORMAL LOW (ref 36.0–46.0)
Hemoglobin: 10.7 g/dL — ABNORMAL LOW (ref 12.0–15.0)
MCH: 25.9 pg — ABNORMAL LOW (ref 26.0–34.0)
MCHC: 31.5 g/dL (ref 30.0–36.0)
MCV: 82.3 fL (ref 80.0–100.0)
Platelets: 256 10*3/uL (ref 150–400)
RBC: 4.13 MIL/uL (ref 3.87–5.11)
RDW: 16.4 % — ABNORMAL HIGH (ref 11.5–15.5)
WBC: 9.5 10*3/uL (ref 4.0–10.5)
nRBC: 0 % (ref 0.0–0.2)

## 2021-06-04 LAB — I-STAT CHEM 8, ED
BUN: 18 mg/dL (ref 8–23)
Calcium, Ion: 1.24 mmol/L (ref 1.15–1.40)
Chloride: 105 mmol/L (ref 98–111)
Creatinine, Ser: 1.1 mg/dL — ABNORMAL HIGH (ref 0.44–1.00)
Glucose, Bld: 122 mg/dL — ABNORMAL HIGH (ref 70–99)
HCT: 34 % — ABNORMAL LOW (ref 36.0–46.0)
Hemoglobin: 11.6 g/dL — ABNORMAL LOW (ref 12.0–15.0)
Potassium: 3.9 mmol/L (ref 3.5–5.1)
Sodium: 139 mmol/L (ref 135–145)
TCO2: 27 mmol/L (ref 22–32)

## 2021-06-04 LAB — DIFFERENTIAL
Abs Immature Granulocytes: 0.03 10*3/uL (ref 0.00–0.07)
Basophils Absolute: 0 10*3/uL (ref 0.0–0.1)
Basophils Relative: 0 %
Eosinophils Absolute: 0.1 10*3/uL (ref 0.0–0.5)
Eosinophils Relative: 1 %
Immature Granulocytes: 0 %
Lymphocytes Relative: 39 %
Lymphs Abs: 3.7 10*3/uL (ref 0.7–4.0)
Monocytes Absolute: 0.6 10*3/uL (ref 0.1–1.0)
Monocytes Relative: 6 %
Neutro Abs: 5 10*3/uL (ref 1.7–7.7)
Neutrophils Relative %: 54 %

## 2021-06-04 LAB — COMPREHENSIVE METABOLIC PANEL
ALT: 11 U/L (ref 0–44)
AST: 19 U/L (ref 15–41)
Albumin: 3.7 g/dL (ref 3.5–5.0)
Alkaline Phosphatase: 84 U/L (ref 38–126)
Anion gap: 7 (ref 5–15)
BUN: 15 mg/dL (ref 8–23)
CO2: 24 mmol/L (ref 22–32)
Calcium: 9.7 mg/dL (ref 8.9–10.3)
Chloride: 106 mmol/L (ref 98–111)
Creatinine, Ser: 1.15 mg/dL — ABNORMAL HIGH (ref 0.44–1.00)
GFR, Estimated: 51 mL/min — ABNORMAL LOW (ref >60)
Glucose, Bld: 132 mg/dL — ABNORMAL HIGH (ref 70–99)
Potassium: 3.6 mmol/L (ref 3.5–5.1)
Sodium: 137 mmol/L (ref 135–145)
Total Bilirubin: 0.1 mg/dL — ABNORMAL LOW (ref 0.3–1.2)
Total Protein: 7.2 g/dL (ref 6.5–8.1)

## 2021-06-04 LAB — PROTIME-INR
INR: 1 (ref 0.8–1.2)
Prothrombin Time: 12.7 seconds (ref 11.4–15.2)

## 2021-06-04 LAB — APTT: aPTT: 37 seconds — ABNORMAL HIGH (ref 24–36)

## 2021-06-04 MED ORDER — SODIUM CHLORIDE 0.9% FLUSH: 3.0000 mL | Freq: Once | INTRAVENOUS | Status: DC

## 2021-06-04 NOTE — ED Triage Notes (Addendum)
Pt to triage via [facility] from home.  Woke up yesterday morning with "speech issues".  Difficulty with word finding and intermittent slurred speech.  LKW 6pm Sunday.  No other neuro deficits.  No arm drift.

## 2021-06-04 NOTE — Telephone Encounter (Signed)
Spoke to patient and her daughter. Patient went by EMS to [facility] ED for slurred speech. Patient states that she feels back to baseline and wants to leave and see her PCP. I told patient and daughter that it is best to wait until discharge but they could decide what to do. They are strongly leaning towards going home and seeing the PCP tomorrow. JM

## 2021-06-04 NOTE — ED Provider Notes (Signed)
Emergency Medicine Provider Triage Evaluation Note  Sharon Peterson , a 70 y.o. female  was evaluated in triage.  Pt complains of "speech issues".  Patient brought by EMS with chief complaint of difficulty with word finding intermittent slurred speech, with last known normal 6 PM Sunday 11/27.  No history of stroke.  States that she has overall been feeling very tired for several weeks.  No known falls, is not chronically on anticoagulation.  Review of Systems  Positive: Speech difficulties Negative: Weakness, numbness, tingling, gait abnormality  Physical Exam  BP 124/72 (BP Location: Right Arm)   Pulse 73   Temp 98.6 F (37 C) (Oral)   Resp 16   SpO2 98%  Gen:   Awake, no distress   Resp:  Normal effort  MSK:   Moves extremities without difficulty  Other:  5/5 strength in all extremities, cranial nerve exam grossly normal, PERRLA, EOMI, no pronator drift Intermittent stuttering, patient states that it takes her longer to find her words  Medical Decision Making  Medically screening exam initiated at 3:37 PM.  Appropriate orders placed.  SCARLET ABAD was informed that the remainder of the evaluation will be completed by another provider, this initial triage assessment does not replace that evaluation, and the importance of remaining in the ED until their evaluation is complete.     Jeanella Flattery 06/04/21 1544    Gerhard Munch, MD 06/08/21 1424

## 2021-06-04 NOTE — ED Notes (Signed)
Patient left without being seen due to wait times  °

## 2021-06-10 DIAGNOSIS — G459 Transient cerebral ischemic attack, unspecified: Secondary | ICD-10-CM | POA: Diagnosis not present

## 2021-06-10 DIAGNOSIS — E1169 Type 2 diabetes mellitus with other specified complication: Secondary | ICD-10-CM | POA: Diagnosis not present

## 2021-06-10 DIAGNOSIS — F17200 Nicotine dependence, unspecified, uncomplicated: Secondary | ICD-10-CM | POA: Diagnosis not present

## 2021-06-10 DIAGNOSIS — Z7984 Long term (current) use of oral hypoglycemic drugs: Secondary | ICD-10-CM | POA: Diagnosis not present

## 2021-08-02 DIAGNOSIS — I10 Essential (primary) hypertension: Secondary | ICD-10-CM | POA: Diagnosis not present

## 2021-08-02 DIAGNOSIS — E1169 Type 2 diabetes mellitus with other specified complication: Secondary | ICD-10-CM | POA: Diagnosis not present

## 2021-08-02 DIAGNOSIS — Z8673 Personal history of transient ischemic attack (TIA), and cerebral infarction without residual deficits: Secondary | ICD-10-CM | POA: Diagnosis not present

## 2021-08-02 DIAGNOSIS — F17200 Nicotine dependence, unspecified, uncomplicated: Secondary | ICD-10-CM | POA: Diagnosis not present

## 2021-11-11 NOTE — Telephone Encounter (Signed)
NA

## 2021-12-31 DIAGNOSIS — Z90710 Acquired absence of both cervix and uterus: Secondary | ICD-10-CM | POA: Diagnosis not present

## 2021-12-31 DIAGNOSIS — F17200 Nicotine dependence, unspecified, uncomplicated: Secondary | ICD-10-CM | POA: Diagnosis not present

## 2021-12-31 DIAGNOSIS — I10 Essential (primary) hypertension: Secondary | ICD-10-CM | POA: Diagnosis not present

## 2021-12-31 DIAGNOSIS — E785 Hyperlipidemia, unspecified: Secondary | ICD-10-CM | POA: Diagnosis not present

## 2021-12-31 DIAGNOSIS — Z7189 Other specified counseling: Secondary | ICD-10-CM | POA: Diagnosis not present

## 2021-12-31 DIAGNOSIS — Z0000 Encounter for general adult medical examination without abnormal findings: Secondary | ICD-10-CM | POA: Diagnosis not present

## 2021-12-31 DIAGNOSIS — Z1331 Encounter for screening for depression: Secondary | ICD-10-CM | POA: Diagnosis not present

## 2021-12-31 DIAGNOSIS — E1169 Type 2 diabetes mellitus with other specified complication: Secondary | ICD-10-CM | POA: Diagnosis not present

## 2022-03-13 ENCOUNTER — Other Ambulatory Visit: Payer: Self-pay | Admitting: Internal Medicine

## 2022-03-13 DIAGNOSIS — Z1231 Encounter for screening mammogram for malignant neoplasm of breast: Secondary | ICD-10-CM

## 2022-03-18 ENCOUNTER — Encounter (INDEPENDENT_AMBULATORY_CARE_PROVIDER_SITE_OTHER): Payer: Medicare HMO | Admitting: Ophthalmology

## 2022-03-18 DIAGNOSIS — R6889 Other general symptoms and signs: Secondary | ICD-10-CM | POA: Diagnosis not present

## 2022-04-09 ENCOUNTER — Ambulatory Visit: Payer: Medicare HMO

## 2022-04-16 ENCOUNTER — Encounter (INDEPENDENT_AMBULATORY_CARE_PROVIDER_SITE_OTHER): Payer: Medicare HMO | Admitting: Ophthalmology

## 2022-04-16 DIAGNOSIS — R6889 Other general symptoms and signs: Secondary | ICD-10-CM | POA: Diagnosis not present

## 2022-04-16 DIAGNOSIS — H35033 Hypertensive retinopathy, bilateral: Secondary | ICD-10-CM | POA: Diagnosis not present

## 2022-04-16 DIAGNOSIS — H43813 Vitreous degeneration, bilateral: Secondary | ICD-10-CM | POA: Diagnosis not present

## 2022-04-16 DIAGNOSIS — I10 Essential (primary) hypertension: Secondary | ICD-10-CM

## 2022-05-02 ENCOUNTER — Ambulatory Visit
Admission: RE | Admit: 2022-05-02 | Discharge: 2022-05-02 | Disposition: A | Discharge status: Home / Self Care | Payer: Medicare HMO | Source: Ambulatory Visit | Attending: Internal Medicine | Admitting: Internal Medicine

## 2022-05-02 DIAGNOSIS — Z1231 Encounter for screening mammogram for malignant neoplasm of breast: Secondary | ICD-10-CM | POA: Diagnosis not present

## 2022-06-16 DIAGNOSIS — I10 Essential (primary) hypertension: Secondary | ICD-10-CM | POA: Diagnosis not present

## 2022-06-16 DIAGNOSIS — F17200 Nicotine dependence, unspecified, uncomplicated: Secondary | ICD-10-CM | POA: Diagnosis not present

## 2022-06-16 DIAGNOSIS — E1169 Type 2 diabetes mellitus with other specified complication: Secondary | ICD-10-CM | POA: Diagnosis not present

## 2022-06-16 DIAGNOSIS — Z23 Encounter for immunization: Secondary | ICD-10-CM | POA: Diagnosis not present

## 2023-01-05 DIAGNOSIS — Z0000 Encounter for general adult medical examination without abnormal findings: Secondary | ICD-10-CM | POA: Diagnosis not present

## 2023-01-05 DIAGNOSIS — I10 Essential (primary) hypertension: Secondary | ICD-10-CM | POA: Diagnosis not present

## 2023-01-05 DIAGNOSIS — H35033 Hypertensive retinopathy, bilateral: Secondary | ICD-10-CM | POA: Diagnosis not present

## 2023-01-05 DIAGNOSIS — F17200 Nicotine dependence, unspecified, uncomplicated: Secondary | ICD-10-CM | POA: Diagnosis not present

## 2023-01-05 DIAGNOSIS — Z7189 Other specified counseling: Secondary | ICD-10-CM | POA: Diagnosis not present

## 2023-01-05 DIAGNOSIS — Z23 Encounter for immunization: Secondary | ICD-10-CM | POA: Diagnosis not present

## 2023-01-05 DIAGNOSIS — Z1331 Encounter for screening for depression: Secondary | ICD-10-CM | POA: Diagnosis not present

## 2023-01-05 DIAGNOSIS — E119 Type 2 diabetes mellitus without complications: Secondary | ICD-10-CM | POA: Diagnosis not present

## 2023-01-05 DIAGNOSIS — F1021 Alcohol dependence, in remission: Secondary | ICD-10-CM | POA: Diagnosis not present

## 2023-01-05 DIAGNOSIS — E785 Hyperlipidemia, unspecified: Secondary | ICD-10-CM | POA: Diagnosis not present

## 2023-01-05 DIAGNOSIS — E1169 Type 2 diabetes mellitus with other specified complication: Secondary | ICD-10-CM | POA: Diagnosis not present

## 2023-01-19 DIAGNOSIS — E1169 Type 2 diabetes mellitus with other specified complication: Secondary | ICD-10-CM | POA: Diagnosis not present

## 2023-01-19 DIAGNOSIS — Z0000 Encounter for general adult medical examination without abnormal findings: Secondary | ICD-10-CM | POA: Diagnosis not present

## 2023-01-19 DIAGNOSIS — E785 Hyperlipidemia, unspecified: Secondary | ICD-10-CM | POA: Diagnosis not present

## 2023-01-19 DIAGNOSIS — Z23 Encounter for immunization: Secondary | ICD-10-CM | POA: Diagnosis not present

## 2023-01-19 DIAGNOSIS — H35033 Hypertensive retinopathy, bilateral: Secondary | ICD-10-CM | POA: Diagnosis not present

## 2023-03-27 DIAGNOSIS — M79606 Pain in leg, unspecified: Secondary | ICD-10-CM | POA: Diagnosis not present

## 2023-03-27 DIAGNOSIS — F17200 Nicotine dependence, unspecified, uncomplicated: Secondary | ICD-10-CM | POA: Diagnosis not present

## 2023-03-27 DIAGNOSIS — E1151 Type 2 diabetes mellitus with diabetic peripheral angiopathy without gangrene: Secondary | ICD-10-CM | POA: Diagnosis not present

## 2023-03-30 ENCOUNTER — Other Ambulatory Visit: Payer: Self-pay | Admitting: Internal Medicine

## 2023-03-30 DIAGNOSIS — I739 Peripheral vascular disease, unspecified: Secondary | ICD-10-CM

## 2023-04-06 ENCOUNTER — Ambulatory Visit
Admission: RE | Admit: 2023-04-06 | Discharge: 2023-04-06 | Disposition: A | Discharge status: Home / Self Care | Payer: Medicare HMO | Source: Ambulatory Visit | Attending: Internal Medicine | Admitting: Internal Medicine

## 2023-04-06 DIAGNOSIS — I739 Peripheral vascular disease, unspecified: Secondary | ICD-10-CM

## 2023-04-10 ENCOUNTER — Other Ambulatory Visit: Payer: Self-pay | Admitting: Internal Medicine

## 2023-04-10 ENCOUNTER — Ambulatory Visit: Payer: Medicare HMO | Admitting: Vascular Surgery

## 2023-04-10 VITALS — BP 136/68 | HR 75 | Temp 97.9°F | Ht 59.0 in | Wt 109.6 lb

## 2023-04-10 DIAGNOSIS — I739 Peripheral vascular disease, unspecified: Secondary | ICD-10-CM

## 2023-04-10 DIAGNOSIS — Z1231 Encounter for screening mammogram for malignant neoplasm of breast: Secondary | ICD-10-CM

## 2023-04-10 NOTE — Progress Notes (Signed)
Patient ID: Sharon Peterson, female   DOB: [birth year], 72 y.o.   MRN: [id number]  Reason for Consult: PVD (R>L leg/calf pain. Denies swelling.  Worse with activity)   Referred by Lorenda Ishihara,*  Subjective:     HPI  Sharon Peterson is a 72 y.o. female with a medical history HTN, HLD and PAD who presents for evaluation of her leg pain.  She reports her her legs specifically her calves hurt when she walks, right greater than left.  She reports she can only make it a couple yards before the pain sets in.  She denies nocturnal rest pain although does have some pain in her right calf which is described as a swollen/tight pain.  She denies nonhealing wounds or ulceration.  She is on a statin but does not take aspirin.  She is a current smoker.  Past Medical History:  Diagnosis Date   Hyperlipidemia    Hypertension    Osteopenia of the elderly 12/16/2013   T score -1.3 at left femur   Family History  Problem Relation Age of Onset   Breast cancer Neg Hx    No past surgical history on file.  Short Social History:  Social History   Tobacco Use   Smoking status: Every Day    Current packs/day: 0.50    Average packs/day: 0.5 packs/day for 56.0 years (28.0 ttl pk-yrs)    Types: Cigarettes   Smokeless tobacco: Never  Substance Use Topics   Alcohol use: No    No Known Allergies  Current Outpatient Medications  Medication Sig Dispense Refill   lisinopril (ZESTRIL) 20 MG tablet Take 1 tablet (20 mg total) by mouth daily. 90 tablet 0   metFORMIN (GLUCOPHAGE-XR) 500 MG 24 hr tablet Take 500 mg by mouth at bedtime.     Omega-3 Fatty Acids (FISH OIL PO) Take by mouth.     rosuvastatin (CRESTOR) 20 MG tablet Take 1 tablet (20 mg total) by mouth daily. 90 tablet 1   No current facility-administered medications for this visit.    REVIEW OF SYSTEMS  Negative other than stated in HPI    Objective:  Objective   Vitals:   04/10/23 1557  BP: 136/68  Pulse: 75  Temp:  97.9 F (36.6 C)  TempSrc: Temporal  SpO2: 98%  Weight: 109 lb 9.6 oz (49.7 kg)  Height: 4\' 11"  (1.499 m)   Body mass index is 22.14 kg/m.  Physical Exam General: no acute distress Cardiac: hemodynamically stable, nontachycardic Pulm: normal work of breathing GI: non-tender, no pulsatile mass  Neuro: alert, no focal deficit Extremities: No edema, cyanosis or wounds Vascular: Palpable femorals bilaterally, nonpalpable pedal's   Data: ABI independently reviewed Right 0.55 with a toe pressure 34 Left 0.85 with a toe pressure of 74     Assessment/Plan:     Sharon Peterson is a 72 y.o. female with hypertension, hyperlipidemia and PAD.  We do lengthy discussion about the natural history and stages of PAD.  I explained that right now with claudication she is at very low risk of limb loss should she make the necessary lifestyle changes.  We discussed the risk and benefits of endovascular intervention although I explained that I think it is best that we proceed with medical management at this time and attempt lifestyle modifications and a walking program. She will take a baby aspirin daily and continue her statin Encourage smoking cessation Plan to follow-up in 1 year with a repeat ABI Encouraged to call  the office for an earlier follow-up should she develop nocturnal rest pain or foot wounds     Daria Pastures MD [facility]

## 2023-05-01 DIAGNOSIS — F17200 Nicotine dependence, unspecified, uncomplicated: Secondary | ICD-10-CM | POA: Diagnosis not present

## 2023-05-01 DIAGNOSIS — E1151 Type 2 diabetes mellitus with diabetic peripheral angiopathy without gangrene: Secondary | ICD-10-CM | POA: Diagnosis not present

## 2023-05-01 DIAGNOSIS — N1831 Chronic kidney disease, stage 3a: Secondary | ICD-10-CM | POA: Diagnosis not present

## 2023-05-01 DIAGNOSIS — E1122 Type 2 diabetes mellitus with diabetic chronic kidney disease: Secondary | ICD-10-CM | POA: Diagnosis not present

## 2023-05-08 ENCOUNTER — Other Ambulatory Visit: Payer: Self-pay

## 2023-05-08 DIAGNOSIS — I739 Peripheral vascular disease, unspecified: Secondary | ICD-10-CM

## 2023-05-15 ENCOUNTER — Ambulatory Visit
Admission: RE | Admit: 2023-05-15 | Discharge: 2023-05-15 | Disposition: A | Discharge status: Home / Self Care | Payer: Medicare HMO | Source: Ambulatory Visit | Attending: Internal Medicine | Admitting: Internal Medicine

## 2023-05-15 DIAGNOSIS — Z1231 Encounter for screening mammogram for malignant neoplasm of breast: Secondary | ICD-10-CM | POA: Diagnosis not present

## 2023-05-26 DIAGNOSIS — E1169 Type 2 diabetes mellitus with other specified complication: Secondary | ICD-10-CM | POA: Diagnosis not present

## 2023-05-26 DIAGNOSIS — F17200 Nicotine dependence, unspecified, uncomplicated: Secondary | ICD-10-CM | POA: Diagnosis not present

## 2023-05-26 DIAGNOSIS — S99921A Unspecified injury of right foot, initial encounter: Secondary | ICD-10-CM | POA: Diagnosis not present

## 2023-05-27 DIAGNOSIS — S99921A Unspecified injury of right foot, initial encounter: Secondary | ICD-10-CM | POA: Diagnosis not present

## 2023-05-27 DIAGNOSIS — M85871 Other specified disorders of bone density and structure, right ankle and foot: Secondary | ICD-10-CM | POA: Diagnosis not present

## 2023-06-09 ENCOUNTER — Ambulatory Visit (INDEPENDENT_AMBULATORY_CARE_PROVIDER_SITE_OTHER): Payer: Medicare HMO | Admitting: Podiatry

## 2023-06-09 DIAGNOSIS — Z91199 Patient's noncompliance with other medical treatment and regimen due to unspecified reason: Secondary | ICD-10-CM

## 2023-06-09 NOTE — Progress Notes (Signed)
No show

## 2023-06-10 DIAGNOSIS — E1151 Type 2 diabetes mellitus with diabetic peripheral angiopathy without gangrene: Secondary | ICD-10-CM | POA: Diagnosis not present

## 2023-06-10 DIAGNOSIS — S92911S Unspecified fracture of right toe(s), sequela: Secondary | ICD-10-CM | POA: Diagnosis not present

## 2023-06-10 DIAGNOSIS — F17200 Nicotine dependence, unspecified, uncomplicated: Secondary | ICD-10-CM | POA: Diagnosis not present

## 2023-09-21 DIAGNOSIS — Z0000 Encounter for general adult medical examination without abnormal findings: Secondary | ICD-10-CM | POA: Diagnosis not present

## 2023-09-21 DIAGNOSIS — E785 Hyperlipidemia, unspecified: Secondary | ICD-10-CM | POA: Diagnosis not present

## 2023-09-21 DIAGNOSIS — F17200 Nicotine dependence, unspecified, uncomplicated: Secondary | ICD-10-CM | POA: Diagnosis not present

## 2023-09-21 DIAGNOSIS — E119 Type 2 diabetes mellitus without complications: Secondary | ICD-10-CM | POA: Diagnosis not present

## 2023-09-21 DIAGNOSIS — I739 Peripheral vascular disease, unspecified: Secondary | ICD-10-CM | POA: Diagnosis not present

## 2023-10-05 DIAGNOSIS — E1122 Type 2 diabetes mellitus with diabetic chronic kidney disease: Secondary | ICD-10-CM | POA: Diagnosis not present

## 2023-10-05 DIAGNOSIS — F324 Major depressive disorder, single episode, in partial remission: Secondary | ICD-10-CM | POA: Diagnosis not present

## 2023-10-05 DIAGNOSIS — I129 Hypertensive chronic kidney disease with stage 1 through stage 4 chronic kidney disease, or unspecified chronic kidney disease: Secondary | ICD-10-CM | POA: Diagnosis not present

## 2023-10-05 DIAGNOSIS — E1165 Type 2 diabetes mellitus with hyperglycemia: Secondary | ICD-10-CM | POA: Diagnosis not present

## 2023-10-05 DIAGNOSIS — M1990 Unspecified osteoarthritis, unspecified site: Secondary | ICD-10-CM | POA: Diagnosis not present

## 2023-10-05 DIAGNOSIS — F1021 Alcohol dependence, in remission: Secondary | ICD-10-CM | POA: Diagnosis not present

## 2023-10-05 DIAGNOSIS — N189 Chronic kidney disease, unspecified: Secondary | ICD-10-CM | POA: Diagnosis not present

## 2023-10-05 DIAGNOSIS — E1151 Type 2 diabetes mellitus with diabetic peripheral angiopathy without gangrene: Secondary | ICD-10-CM | POA: Diagnosis not present

## 2023-10-05 DIAGNOSIS — E785 Hyperlipidemia, unspecified: Secondary | ICD-10-CM | POA: Diagnosis not present

## 2023-10-16 DIAGNOSIS — Z79899 Other long term (current) drug therapy: Secondary | ICD-10-CM | POA: Diagnosis not present

## 2023-10-16 DIAGNOSIS — Z0189 Encounter for other specified special examinations: Secondary | ICD-10-CM | POA: Diagnosis not present

## 2023-10-16 DIAGNOSIS — E1151 Type 2 diabetes mellitus with diabetic peripheral angiopathy without gangrene: Secondary | ICD-10-CM | POA: Diagnosis not present

## 2023-10-16 DIAGNOSIS — I739 Peripheral vascular disease, unspecified: Secondary | ICD-10-CM | POA: Diagnosis not present

## 2023-10-16 DIAGNOSIS — Z1159 Encounter for screening for other viral diseases: Secondary | ICD-10-CM | POA: Diagnosis not present

## 2023-10-16 DIAGNOSIS — Z0001 Encounter for general adult medical examination with abnormal findings: Secondary | ICD-10-CM | POA: Diagnosis not present

## 2023-10-16 DIAGNOSIS — Z136 Encounter for screening for cardiovascular disorders: Secondary | ICD-10-CM | POA: Diagnosis not present

## 2023-10-16 DIAGNOSIS — F17200 Nicotine dependence, unspecified, uncomplicated: Secondary | ICD-10-CM | POA: Diagnosis not present

## 2023-10-16 DIAGNOSIS — E1142 Type 2 diabetes mellitus with diabetic polyneuropathy: Secondary | ICD-10-CM | POA: Diagnosis not present

## 2023-10-26 ENCOUNTER — Other Ambulatory Visit: Payer: Self-pay | Admitting: Nurse Practitioner

## 2023-10-26 DIAGNOSIS — E2839 Other primary ovarian failure: Secondary | ICD-10-CM

## 2024-02-01 DIAGNOSIS — Z79899 Other long term (current) drug therapy: Secondary | ICD-10-CM | POA: Diagnosis not present

## 2024-02-01 DIAGNOSIS — E1142 Type 2 diabetes mellitus with diabetic polyneuropathy: Secondary | ICD-10-CM | POA: Diagnosis not present

## 2024-02-01 DIAGNOSIS — I10 Essential (primary) hypertension: Secondary | ICD-10-CM | POA: Diagnosis not present

## 2024-02-01 DIAGNOSIS — Z0000 Encounter for general adult medical examination without abnormal findings: Secondary | ICD-10-CM | POA: Diagnosis not present

## 2024-04-12 ENCOUNTER — Other Ambulatory Visit: Payer: Self-pay | Admitting: Nurse Practitioner

## 2024-04-12 DIAGNOSIS — Z1231 Encounter for screening mammogram for malignant neoplasm of breast: Secondary | ICD-10-CM

## 2024-04-26 DIAGNOSIS — Z5320 Procedure and treatment not carried out because of patient's decision for unspecified reasons: Secondary | ICD-10-CM | POA: Diagnosis not present

## 2024-04-26 DIAGNOSIS — H524 Presbyopia: Secondary | ICD-10-CM | POA: Diagnosis not present

## 2024-05-16 ENCOUNTER — Ambulatory Visit
Admission: RE | Admit: 2024-05-16 | Discharge: 2024-05-16 | Disposition: A | Discharge status: Home / Self Care | Source: Ambulatory Visit | Attending: Nurse Practitioner | Admitting: Nurse Practitioner

## 2024-05-16 DIAGNOSIS — Z1231 Encounter for screening mammogram for malignant neoplasm of breast: Secondary | ICD-10-CM

## 2024-06-10 ENCOUNTER — Other Ambulatory Visit: Payer: Self-pay

## 2024-06-10 DIAGNOSIS — I739 Peripheral vascular disease, unspecified: Secondary | ICD-10-CM

## 2024-06-13 ENCOUNTER — Other Ambulatory Visit (HOSPITAL_BASED_OUTPATIENT_CLINIC_OR_DEPARTMENT_OTHER)

## 2024-06-13 ENCOUNTER — Encounter (HOSPITAL_BASED_OUTPATIENT_CLINIC_OR_DEPARTMENT_OTHER): Payer: Self-pay

## 2024-07-14 ENCOUNTER — Ambulatory Visit (HOSPITAL_BASED_OUTPATIENT_CLINIC_OR_DEPARTMENT_OTHER)
Admission: RE | Admit: 2024-07-14 | Discharge: 2024-07-14 | Disposition: A | Discharge status: Home / Self Care | Source: Ambulatory Visit | Attending: Nurse Practitioner | Admitting: Nurse Practitioner

## 2024-07-14 DIAGNOSIS — E2839 Other primary ovarian failure: Secondary | ICD-10-CM | POA: Insufficient documentation

## 2024-07-15 ENCOUNTER — Ambulatory Visit

## 2024-07-15 ENCOUNTER — Ambulatory Visit (HOSPITAL_COMMUNITY)
Admission: RE | Admit: 2024-07-15 | Discharge: 2024-07-15 | Disposition: A | Source: Ambulatory Visit | Attending: Surgery | Admitting: Surgery

## 2024-07-15 DIAGNOSIS — I739 Peripheral vascular disease, unspecified: Secondary | ICD-10-CM | POA: Insufficient documentation

## 2024-07-15 LAB — VAS US ABI WITH/WO TBI
Left ABI: 0.95
Right ABI: 0.46

## 2024-07-18 ENCOUNTER — Other Ambulatory Visit

## 2024-07-29 ENCOUNTER — Ambulatory Visit: Attending: Vascular Surgery | Admitting: Physician Assistant

## 2024-07-29 VITALS — BP 118/61 | HR 70 | Temp 97.7°F | Ht 59.0 in | Wt 112.0 lb

## 2024-07-29 DIAGNOSIS — I739 Peripheral vascular disease, unspecified: Secondary | ICD-10-CM

## 2024-07-29 NOTE — Progress Notes (Signed)
 "   Office Note   History of Present Illness   Sharon Peterson is a 74 y.o. (1951-02-07) female who presents for surveillance of PAD.  She has a history of right lower extremity claudication that is not lifestyle limiting.  She has no prior history of lower extremity revascularization.  She returns today for follow-up.  She says that she feels about the same as she did last year.  She can usually get about 20 yards before she has to stop and rest due to claudication in the right calf.  She denies any claudication in the left.  She denies any rest pain or tissue loss.  She takes a statin daily.  Current Outpatient Medications  Medication Sig Dispense Refill   lisinopril  (ZESTRIL ) 20 MG tablet Take 1 tablet (20 mg total) by mouth daily. 90 tablet 0   metFORMIN (GLUCOPHAGE-XR) 500 MG 24 hr tablet Take 500 mg by mouth at bedtime.     Omega-3 Fatty Acids (FISH OIL PO) Take by mouth.     rosuvastatin  (CRESTOR ) 20 MG tablet Take 1 tablet (20 mg total) by mouth daily. 90 tablet 1   No current facility-administered medications for this visit.    REVIEW OF SYSTEMS (negative unless checked):   Cardiac:  []  Chest pain or chest pressure? []  Shortness of breath upon activity? []  Shortness of breath when lying flat? []  Irregular heart rhythm?  Vascular:  [x]  Pain in calf, thigh, or hip brought on by walking? []  Pain in feet at night that wakes you up from your sleep? []  Blood clot in your veins? []  Leg swelling?  Pulmonary:  []  Oxygen at home? []  Productive cough? []  Wheezing?  Neurologic:  []  Sudden weakness in arms or legs? []  Sudden numbness in arms or legs? []  Sudden onset of difficult speaking or slurred speech? []  Temporary loss of vision in one eye? []  Problems with dizziness?  Gastrointestinal:  []  Blood in stool? []  Vomited blood?  Genitourinary:  []  Burning when urinating? []  Blood in urine?  Psychiatric:  []  Major depression  Hematologic:  []  Bleeding  problems? []  Problems with blood clotting?  Dermatologic:  []  Rashes or ulcers?  Constitutional:  []  Fever or chills?  Ear/Nose/Throat:  []  Change in hearing? []  Nose bleeds? []  Sore throat?  Musculoskeletal:  []  Back pain? []  Joint pain? []  Muscle pain?   Physical Examination   Vitals:   07/29/24 1346  BP: 118/61  Pulse: 70  Temp: 97.7 F (36.5 C)  SpO2: 98%  Weight: 112 lb (50.8 kg)  Height: 4' 11 (1.499 m)   Body mass index is 22.62 kg/m.  General:  WDWN in NAD; vital signs documented above Gait: Not observed HENT: WNL, normocephalic Pulmonary: normal non-labored breathing , Cardiac: regular Abdomen: soft, NT, no masses Skin: without rashes Vascular Exam/Pulses: Monophasic right DP/PT Doppler signals.  Biphasic left DP/PT Doppler signals Extremities: without ischemic changes, without gangrene , without cellulitis; without open wounds;  Musculoskeletal: no muscle wasting or atrophy  Neurologic: A&O X 3;  No focal weakness or paresthesias are detected Psychiatric:  The pt has normal affect  Non-Invasive Vascular imaging   ABI (07/15/2024) R:  ABI: 0.46 (0.55),  PT: mono DP: mono TBI:  0.19 L:  ABI: 0.95 (0.85),  PT: bi DP: bi TBI: 0.66   Medical Decision Making   Sharon Peterson is a 74 y.o. female who presents for surveillance of PAD  Based on the patient's vascular studies, her ABIs on the right  have slightly decreased from 0.55 to 0.46.  Her ABIs on the left have increased from 0.85 to 0.95 The patient reports that she has stable, non lifestyle-limiting claudication in the right calf.  She can usually walk about 20 yards before her cramping starts.  She denies any rest pain or tissue loss. On exam she has monophasic Doppler signals on the right and biphasic Doppler signals on the left She can follow-up with our office in 1 year with repeat ABIs.  I encouraged her to call our office sooner if she develops any rest pain or wounds   Sharon Holster PA-C [facility] Office: [phone]  Clinic MD: Sharon Peterson  "
